# Patient Record
Sex: Male | Born: 1955 | Race: White | Hispanic: No | Marital: Married | Smoking: Never smoker
Health system: Southern US, Community
[De-identification: ages and names within clinical notes are randomized; demographics above are authoritative.]

## PROBLEM LIST (undated history)

## (undated) DIAGNOSIS — N529 Male erectile dysfunction, unspecified: Secondary | ICD-10-CM

## (undated) DIAGNOSIS — R7301 Impaired fasting glucose: Secondary | ICD-10-CM

## (undated) DIAGNOSIS — G603 Idiopathic progressive neuropathy: Secondary | ICD-10-CM

## (undated) DIAGNOSIS — E785 Hyperlipidemia, unspecified: Secondary | ICD-10-CM

## (undated) DIAGNOSIS — M722 Plantar fascial fibromatosis: Secondary | ICD-10-CM

## (undated) DIAGNOSIS — R202 Paresthesia of skin: Secondary | ICD-10-CM

## (undated) DIAGNOSIS — I69898 Other sequelae of other cerebrovascular disease: Secondary | ICD-10-CM

## (undated) DIAGNOSIS — K219 Gastro-esophageal reflux disease without esophagitis: Secondary | ICD-10-CM

## (undated) DIAGNOSIS — I10 Essential (primary) hypertension: Secondary | ICD-10-CM

## (undated) HISTORY — DX: Plantar fascial fibromatosis: M72.2

## (undated) HISTORY — DX: Impaired fasting glucose: R73.01

## (undated) HISTORY — DX: Idiopathic progressive neuropathy: G60.3

## (undated) HISTORY — DX: Other sequelae of other cerebrovascular disease: I69.898

## (undated) HISTORY — DX: Paresthesia of skin: R20.2

## (undated) HISTORY — PX: HERNIA REPAIR: SHX51

## (undated) HISTORY — DX: Gastro-esophageal reflux disease without esophagitis: K21.9

## (undated) HISTORY — DX: Male erectile dysfunction, unspecified: N52.9

## (undated) HISTORY — DX: Essential (primary) hypertension: I10

## (undated) HISTORY — DX: Hyperlipidemia, unspecified: E78.5

---

## 1979-10-23 HISTORY — PX: TESTICLE REMOVAL: SHX68

## 2009-10-22 LAB — HM COLONOSCOPY

## 2011-11-02 DIAGNOSIS — I10 Essential (primary) hypertension: Secondary | ICD-10-CM | POA: Insufficient documentation

## 2017-09-19 ENCOUNTER — Ambulatory Visit: Payer: Self-pay | Admitting: Neurology

## 2017-09-23 ENCOUNTER — Encounter (INDEPENDENT_AMBULATORY_CARE_PROVIDER_SITE_OTHER): Payer: Self-pay

## 2017-09-23 ENCOUNTER — Ambulatory Visit: Payer: BC Managed Care – PPO | Admitting: Neurology

## 2017-09-23 ENCOUNTER — Encounter: Payer: Self-pay | Admitting: Neurology

## 2017-09-23 VITALS — BP 128/80 | HR 62 | Wt 222.8 lb

## 2017-09-23 DIAGNOSIS — G3184 Mild cognitive impairment, so stated: Secondary | ICD-10-CM

## 2017-09-23 DIAGNOSIS — I6990 Unspecified sequelae of unspecified cerebrovascular disease: Secondary | ICD-10-CM | POA: Diagnosis not present

## 2017-09-23 DIAGNOSIS — R413 Other amnesia: Secondary | ICD-10-CM

## 2017-09-23 NOTE — Patient Instructions (Signed)
I had a long discussion with the patient regarding his mild memory loss and cognitive impairment symptoms. Check lab work for reversible causes of memory loss, EEG and MRI scan of the brain. We discussed memory compensation strategies and encouraged him to be active in mentally challenging activities like playing bridge, sudoku and solving crossword puzzles. We also discussed possible interest in participation in the [facility] [place] early dementia trial given his family history of Alzheimer's but he wants time to think about it Check screening follow-up MRA of the brain and neck. Continue aspirin for stroke prevention but reduced the dose to 81 mg daily due to bruising. Maintain strict control of hypertension with blood pressure goal below 130/90 and lipids with LDL cholesterol goal below 70 mg percent. I encouraged him to eat a healthy diet with lots of fruits, vegetables, cereals and to be active and not gain weight. He will return for follow-up in 2 months or call earlier if necessary  Memory Compensation Strategies  1. Use "WARM" strategy.  W= write it down  A= associate it  R= repeat it  M= make a mental note  2.   You can keep a Glass blower/designerMemory Notebook.  Use a 3-ring notebook with sections for the following: calendar, important names and phone numbers,  medications, doctors' names/phone numbers, lists/reminders, and a section to journal what you did  each day.   3.    Use a calendar to write appointments down.  4.    Write yourself a schedule for the day.  This can be placed on the calendar or in a separate section of the Memory Notebook.  Keeping a  regular schedule can help memory.  5.    Use medication organizer with sections for each day or morning/evening pills.  You may need help loading it  6.    Keep a basket, or pegboard by the door.  Place items that you need to take out with you in the basket or on the pegboard.  You may also want to  include a message board for reminders.  7.    Use  sticky notes.  Place sticky notes with reminders in a place where the task is performed.  For example: " turn off the  stove" placed by the stove, "lock the door" placed on the door at eye level, " take your medications" on  the bathroom mirror or by the place where you normally take your medications.  8.    Use alarms/timers.  Use while cooking to remind yourself to check on food or as a reminder to take your medicine, or as a  reminder to make a call, or as a reminder to perform another task, etc.

## 2017-09-23 NOTE — Progress Notes (Signed)
[facility] [address] [place] [address] [phone]       OFFICE CONSULT NOTE  Mr. Jack CoronaBruce W Pierce Date of Birth:  01/04/1956 Medical Record Number:  [id number]   Referring MD: Blane OharaKirsten Cox  Reason for Referral: Memory loss following stroke HPI: Jack Pierce is a 61 year old pleasant male who has noticed memory difficulties and mild cognitive impairment following remote stroke in 2012.  He was admitted to [facility] at that time an MRI had shown a small left thalamic lacunar infarct.  He saw me for consultation and then was lost to follow-up.  He states for the last 3 years he has had short-term memory difficulties which she feels may be slightly progressing.  He gets frustrated easily since he cannot remember recent appointments and particularly people's faces.  Is also noticed that he can tired easily and cannot concentrate and do prolonged activities.  He had times feels emotionally as well as physically drained.  He has had mild residual numbness and balance difficulties following his stroke which have persisted.  He works as a Programmer, multimediapreacher and feels that he is not able to function at his full capacity.  He does have a family history of Alzheimer's in his mother and worries that he may be developing that.  He denies any recent headaches, focal extremity weakness, numbness, diplopia, blurred vision or falls or head injuries with loss of consciousness.  He has had no documented seizures.  He has had no recurrent stroke or TIA symptoms in the last 6 years.  He remains on aspirin which is tolerating well with only minor bruising.  He states his blood pressure is under good control and he remains on pravastatin.  He is tolerating that as well without significant muscle aches and pains.  ROS:   14 system review of systems is positive for fatigue, wheezing, easy bruising, easy bleeding, confusion, numbness, memory loss, decreased energy, importance and all other  systems negative  PMH:  #1 left thalamic infarct 2012.  #2 cancer 1981.  #3 hernia 1961.  #4 hypertension.  Social History:  Social History   Socioeconomic History  . Marital status: Married    Spouse name: Not on file  . Number of children: Not on file  . Years of education: Not on file  . Highest education level: Not on file  Social Needs  . Financial resource strain: Not on file  . Food insecurity - worry: Not on file  . Food insecurity - inability: Not on file  . Transportation needs - medical: Not on file  . Transportation needs - non-medical: Not on file  Occupational History  . Not on file  Tobacco Use  . Smoking status: Not on file  Substance and Sexual Activity  . Alcohol use: No    Frequency: Never  . Drug use: No  . Sexual activity: Not on file  Other Topics Concern  . Not on file  Social History Narrative  . Not on file    Medications:   Current Outpatient Medications on File Prior to Visit  Medication Sig Dispense Refill  . aspirin EC 81 MG tablet Take 81 mg by mouth daily.    . chlorthalidone (HYGROTON) 25 MG tablet Take 12.5 mg by mouth daily.    Marland Kitchen. lisinopril (PRINIVIL,ZESTRIL) 20 MG tablet Take 20 mg by mouth daily.    . pantoprazole (PROTONIX) 40 MG tablet Take 40 mg by mouth 2 (two) times daily.    . pravastatin (PRAVACHOL) 40  MG tablet Take 40 mg by mouth daily.     No current facility-administered medications on file prior to visit.     Allergies:  No Known Allergies  Physical Exam General: well developed, well nourished pleasant 61-year-old male, seated, in no evident distress Head: head normocephalic and atraumatic.   Neck: supple with no carotid or supraclavicular bruits Cardiovascular: regular rate and rhythm, no murmurs Musculoskeletal: no deformity Skin:  no rash/petichiae Vascular:  Normal pulses all extremities  Neurologic Exam Mental Status: Awake and fully alert. Oriented to place and time. Recent and remote memory intact.  Attention span, concentration and fund of knowledge.  Diminished recall 2/3.  Animal naming test could name only 10 animals with 4 legs.  Clock drawing 3/4.  Appropriate. Mood and affect appropriate.  Cranial Nerves: Fundoscopic exam reveals sharp disc margins. Pupils equal, briskly reactive to light. Extraocular movements full without nystagmus. Visual fields full to confrontation. Hearing intact. Facial sensation intact. Face, tongue, palate moves normally and symmetrically.  Motor: Normal bulk and tone. Normal strength in all tested extremity muscles. Sensory.: intact to touch , pinprick , position and vibratory sensation.  Coordination: Rapid alternating movements normal in all extremities. Finger-to-nose and heel-to-shin performed accurately bilaterally. Gait and Station: Arises from chair without difficulty. Stance is normal. Gait demonstrates normal stride length and balance . Able to heel, toe and tandem walk without difficulty.  Reflexes: 1+ and symmetric. Toes downgoing.   NIHSS 0 Modified Rankin  1   ASSESSMENT:  61 year old male with remote history of left thalamic infarct and 2012 due to small vessel disease with a 3-year history of mild memory disturbance and mild cognitive impairment.   PLAN: I had a long discussion with the patient regarding his mild memory loss and cognitive impairment symptoms. Check lab work for reversible causes of memory loss, EEG and MRI scan of the brain. We discussed memory compensation strategies and encouraged him to be active in mentally challenging activities like playing bridge, sudoku and solving crossword puzzles. We also discussed possible interest in participation in the [facility] [place] early dementia trial given his family history of Alzheimer's but he wants time to think about it Check screening follow-up MRA of the brain and neck. Continue aspirin for stroke prevention but reduced the dose to 81 mg daily due to bruising. Maintain strict control of  hypertension with blood pressure goal below 130/90 and lipids with LDL cholesterol goal below 70 mg percent. I encouraged him to eat a healthy diet with lots of fruits, vegetables, cereals and to be active and not gain weight.  Greater than 50% time during this 50-minute consultation visit was spent on counseling and coordination of care about his memory loss and mild cognitive impairment and remote stroke and answering questions.  He will return for follow-up in 2 months or call earlier if necessary Delia HeadyPramod Lesslie Mossa, MD Medical Director [facility] Pager: [phone] 09/23/2017 8:34 PM  Note: This document was prepared with digital dictation and possible smart phrase technology. Any transcriptional errors that result from this process are unintentional.

## 2017-09-25 LAB — DEMENTIA PANEL
Homocysteine: 22.4 umol/L — ABNORMAL HIGH (ref 0.0–15.0)
RPR: NONREACTIVE
TSH: 2.75 u[IU]/mL (ref 0.450–4.500)
Vitamin B-12: 275 pg/mL (ref 232–1245)

## 2017-10-03 ENCOUNTER — Telehealth: Payer: Self-pay

## 2017-10-03 NOTE — Telephone Encounter (Signed)
Rn was on the phone giving lab work for patient. While on the phone pt stated he had to cancel the EEG, MRA neck, MRA head because of the high payments he would have to make on it monthly. He cannot afford it now.Jack Pierce. He wanted DR.Sethi to know about this.

## 2017-10-03 NOTE — Telephone Encounter (Signed)
-----   Message from Micki RileyPramod S Sethi, MD sent at 09/27/2017  3:09 PM EST ----- Kindly call the patient and let him know that lab work was unremarkable except for elevated homocystine. Ask  patient to start taking folic acid over-the-counter 1 mg daily and if patient is smoking to quit smoking

## 2017-10-03 NOTE — Telephone Encounter (Signed)
FYI Dr. Pearlean BrownieSethi thanks.

## 2017-10-03 NOTE — Telephone Encounter (Signed)
Rn confirmed with patient is not a smoker.

## 2017-10-03 NOTE — Telephone Encounter (Signed)
Notes recorded by Hildred AlaminMurrell, Atziri Zubiate Y, RN on 10/03/2017 at 4:28 PM EST Rn call patient about lab work for dementia panel. Rn stated the lab work was unremarkable but had elevated homocystine. He wants pt to take over the counter 1mg  folic acid. Pt wrote down the otc medication he should take. Pt verbalized understanding. ------

## 2017-10-07 ENCOUNTER — Other Ambulatory Visit: Payer: BC Managed Care – PPO

## 2017-11-28 ENCOUNTER — Telehealth: Payer: Self-pay | Admitting: Neurology

## 2017-11-28 NOTE — Telephone Encounter (Signed)
Patient cancelled upcoming apt due to not having testing done (financial reasons). Wants call back if Dr. Pearlean BrownieSethi feels he still needs to come for follow up. Best call back [phone]

## 2017-11-28 NOTE — Telephone Encounter (Signed)
RN call patient back about cancelling appt with Dr .Pearlean BrownieSethi last week. PT stated he was unable to get all 3 scans because of the required payment. He is on a fix income, and does not have the money now to pay for the MRI. Pt stated also its cheaper to him to seek his PCP its 10.00. When he sees a specialist its 30.00 to 40.00 dollars. He wanted to know about the folic acid 1mg  Dr. Pearlean BrownieSethi recommend. He is taking it OTC, but wanted to know how long he should be on in. Rn stated his homocysteine lab was elevated at 22.4. Rn stated the appt he cancel can be r/s.  He can discuss the elevated homocysteine lab if he reschedules.Pt stated he did not want to r/s due to financial issues. Rn stated he can seek his PCP, and discuss it with her. RN stated the labs will be fax to his PCP to evaluate the lab, and he can follow up with her. Rn will fax labs to PCP.

## 2017-12-02 ENCOUNTER — Ambulatory Visit: Payer: BC Managed Care – PPO | Admitting: Neurology

## 2018-01-07 DIAGNOSIS — M722 Plantar fascial fibromatosis: Secondary | ICD-10-CM | POA: Insufficient documentation

## 2018-01-07 DIAGNOSIS — M6240 Contracture of muscle, unspecified site: Secondary | ICD-10-CM | POA: Insufficient documentation

## 2018-10-30 DIAGNOSIS — G5793 Unspecified mononeuropathy of bilateral lower limbs: Secondary | ICD-10-CM | POA: Insufficient documentation

## 2019-02-26 DIAGNOSIS — I6381 Other cerebral infarction due to occlusion or stenosis of small artery: Secondary | ICD-10-CM | POA: Insufficient documentation

## 2019-04-06 DIAGNOSIS — R202 Paresthesia of skin: Secondary | ICD-10-CM | POA: Insufficient documentation

## 2019-12-21 DIAGNOSIS — E782 Mixed hyperlipidemia: Secondary | ICD-10-CM

## 2019-12-21 DIAGNOSIS — R202 Paresthesia of skin: Secondary | ICD-10-CM

## 2019-12-22 ENCOUNTER — Other Ambulatory Visit: Payer: Self-pay

## 2019-12-22 ENCOUNTER — Encounter: Payer: Self-pay | Admitting: Family Medicine

## 2019-12-22 ENCOUNTER — Ambulatory Visit: Payer: Self-pay | Admitting: Family Medicine

## 2019-12-22 ENCOUNTER — Ambulatory Visit: Payer: BC Managed Care – PPO | Admitting: Family Medicine

## 2019-12-22 VITALS — BP 132/82 | HR 60 | Temp 97.0°F | Resp 16 | Ht 72.0 in | Wt 217.0 lb

## 2019-12-22 DIAGNOSIS — I10 Essential (primary) hypertension: Secondary | ICD-10-CM

## 2019-12-22 DIAGNOSIS — E782 Mixed hyperlipidemia: Secondary | ICD-10-CM | POA: Diagnosis not present

## 2019-12-22 DIAGNOSIS — I69898 Other sequelae of other cerebrovascular disease: Secondary | ICD-10-CM

## 2019-12-22 DIAGNOSIS — G603 Idiopathic progressive neuropathy: Secondary | ICD-10-CM | POA: Insufficient documentation

## 2019-12-22 DIAGNOSIS — R7301 Impaired fasting glucose: Secondary | ICD-10-CM

## 2019-12-22 MED ORDER — PANTOPRAZOLE SODIUM 40 MG PO TBEC: 40.0000 mg | DELAYED_RELEASE_TABLET | Freq: Two times a day (BID) | ORAL | 1 refills | Status: DC

## 2019-12-22 MED ORDER — IBUPROFEN 600 MG PO TABS: 600.0000 mg | ORAL_TABLET | Freq: Three times a day (TID) | ORAL | 5 refills | Status: DC

## 2019-12-22 MED ORDER — PREGABALIN 25 MG PO CAPS: 25.0000 mg | ORAL_CAPSULE | Freq: Three times a day (TID) | ORAL | 0 refills | Status: DC

## 2019-12-22 MED ORDER — FLUTICASONE PROPIONATE 50 MCG/ACT NA SUSP: 2.0000 | Freq: Every day | NASAL | 5 refills | Status: DC

## 2019-12-22 MED ORDER — LAMOTRIGINE 100 MG PO TABS: 100.0000 mg | ORAL_TABLET | Freq: Every day | ORAL | 0 refills | Status: DC

## 2019-12-22 MED ORDER — LISINOPRIL 20 MG PO TABS: 20.0000 mg | ORAL_TABLET | Freq: Every day | ORAL | 1 refills | Status: DC

## 2019-12-22 NOTE — Patient Instructions (Signed)
Start on Lyrica 25 mg one three times a day. Call in one month and let us know how legs are doing. Continue lamictal. No changes to other medicines.

## 2019-12-22 NOTE — Progress Notes (Signed)
Subjective:  Patient ID: Jack Pierce, male    DOB: 01-Mar-1956  Age: 64 y.o. MRN: [id number]  Chief Complaint  Patient presents with  . Hyperlipidemia  . Hypertension    HPI Hyperlipidemia was diagnosed in 2013.  Pravastatin 40 mg 1 at night.  Patient is eating healthy.  Patient is exercising.  Hypertension was diagnosed in 2012.  Patient had a stroke and has residual symptoms of memory loss.  Patient is currently taking lisinopril 20 mg once daily and chlorthalidone 25 mg half pill once daily.  Patient sees Dr. Hartford Poli in [facility]  She has idiopathic progressive neuropathy.  Has been prescribed Lamictal 100 mg once daily.  He failed on gabapentin which made him loopy.  Lyrica, and duloxetine. Social History   Socioeconomic History  . Marital status: Married    Spouse name: Not on file  . Number of children: 2  . Years of education: Not on file  . Highest education level: Not on file  Occupational History  . Occupation: Retired  Tobacco Use  . Smoking status: Never Smoker  . Smokeless tobacco: Never Used  Substance and Sexual Activity  . Alcohol use: No  . Drug use: No  . Sexual activity: Not on file  Other Topics Concern  . Not on file  Social History Narrative  . Not on file   Social Determinants of Health   Financial Resource Strain:   . Difficulty of Paying Living Expenses:   Food Insecurity:   . Worried About Charity fundraiser in the Last Year:   . Arboriculturist in the Last Year:   Transportation Needs:   . Film/video editor (Medical):   Marland Kitchen Lack of Transportation (Non-Medical):   Physical Activity:   . Days of Exercise per Week:   . Minutes of Exercise per Session:   Stress:   . Feeling of Stress :   Social Connections:   . Frequency of Communication with Friends and Family:   . Frequency of Social Gatherings with Friends and Family:   . Attends Religious Services:   . Active Member of Clubs or Organizations:   . Attends Theatre manager Meetings:   Marland Kitchen Marital Status:    Past Medical History:  Diagnosis Date  . Essential (primary) hypertension   . Gastro-esophageal reflux disease without esophagitis   . Hyperlipidemia   . Idiopathic progressive neuropathy   . Impaired fasting glucose   . Male erectile disorder   . Other sequelae of other cerebrovascular disease   . Paresthesias   . Plantar fascial fibromatosis    Family History  Problem Relation Age of Onset  . Heart attack Mother   . Coronary artery disease Father   . CAD Brother   . Suicidality Brother   . CAD Sister     Review of Systems  Constitutional: Negative for chills, fatigue and fever.  HENT: Negative for congestion, ear pain and sore throat.   Respiratory: Negative for cough and shortness of breath.   Cardiovascular: Negative for chest pain.  Gastrointestinal: Negative for abdominal pain, constipation, diarrhea, nausea and vomiting.  Endocrine: Negative for polydipsia, polyphagia and polyuria.  Genitourinary: Negative for dysuria and frequency.  Musculoskeletal: Negative for arthralgias and myalgias.  Neurological: Negative for dizziness and headaches.  Psychiatric/Behavioral: Negative for dysphoric mood.       No dysphoria     Objective:  BP 132/82   Pulse 60   Temp (!) 97 F (36.1 C)  Resp 16   Ht 6' (1.829 m)   Wt 217 lb (98.4 kg)   BMI 29.43 kg/m   BP/Weight 12/22/2019 09/23/2017  Systolic BP 132 128  Diastolic BP 82 80  Wt. (Lbs) 217 222.8  BMI 29.43 -    Physical Exam Vitals reviewed.  Constitutional:      Appearance: Normal appearance.  Neck:     Vascular: No carotid bruit.  Cardiovascular:     Rate and Rhythm: Normal rate and regular rhythm.     Pulses: Normal pulses.     Heart sounds: Normal heart sounds.  Pulmonary:     Effort: Pulmonary effort is normal.     Breath sounds: Normal breath sounds. No wheezing, rhonchi or rales.  Abdominal:     General: Bowel sounds are normal.     Palpations:  Abdomen is soft.     Tenderness: There is no abdominal tenderness.  Neurological:     Mental Status: He is alert.  Psychiatric:        Mood and Affect: Mood normal.        Behavior: Behavior normal.     Lab Results  Component Value Date   WBC 5.8 12/22/2019   HGB 12.7 (L) 12/22/2019   HCT 38.3 12/22/2019   PLT 189 12/22/2019   GLUCOSE 105 (H) 12/22/2019   CHOL 142 12/22/2019   TRIG 108 12/22/2019   HDL 53 12/22/2019   LDLCALC 69 12/22/2019   AST 18 12/22/2019   NA 138 12/22/2019   K 4.4 12/22/2019   CL 97 12/22/2019   CREATININE 1.34 (H) 12/22/2019   BUN 11 12/22/2019   TSH 4.040 12/22/2019   HGBA1C 5.9 (H) 12/22/2019      Assessment & Plan:  Impaired fasting glucose Continue low sugar diet.  Recommend exercise.  Labs drawn today.  Essential hypertension, benign Well controlled.  No changes to medicines.  Continue to work on eating a healthy diet and exercise.  Labs drawn today.   Idiopathic progressive neuropathy Fair control, but still bothers him a lot.. Start on Lyrica 25 mg one three times a day. Call in one month and let us know how legs are doing. Continue lamictal. No changes to other medicines.  Other sequelae of other cerebrovascular disease Patient has memory problems related to the stroke he had.  Recommended to continue aspirin 81 mg once daily and pravastatin.  His memory has not worsened and has just not fully returned to normal nor do I expect it to at this point.  Mixed hyperlipidemia Well controlled.  No changes to medicines.  Continue to work on eating a healthy diet and exercise.  Labs drawn today.   Orders Placed This Encounter  Procedures  . CBC with Differential/Platelet  . Lipid panel  . Hemoglobin A1c  . Comp. Metabolic Panel (12)  . TSH  . Cardiovascular Risk Assessment    Meds ordered this encounter  Medications  . pregabalin (LYRICA) 25 MG capsule    Sig: Take 1 capsule (25 mg total) by mouth 3 (three) times daily.     Dispense:  90 capsule    Refill:  0  . lamoTRIgine (LAMICTAL) 100 MG tablet    Sig: Take 1 tablet (100 mg total) by mouth daily.    Dispense:  90 tablet    Refill:  0  . pantoprazole (PROTONIX) 40 MG tablet    Sig: Take 1 tablet (40 mg total) by mouth 2 (two) times daily.    Dispense:  180  tablet    Refill:  1  . fluticasone (FLONASE) 50 MCG/ACT nasal spray    Sig: Place 2 sprays into both nostrils daily.    Dispense:  16 g    Refill:  5  . lisinopril (ZESTRIL) 20 MG tablet    Sig: Take 1 tablet (20 mg total) by mouth daily.    Dispense:  90 tablet    Refill:  1  . ibuprofen (ADVIL) 600 MG tablet    Sig: Take 1 tablet (600 mg total) by mouth 3 (three) times daily. With food    Dispense:  90 tablet    Refill:  5       Follow-up: Return in about 6 months (around 06/23/2020).  AVS was given to patient prior to departure.  Blane Ohara [facility] [phone]

## 2019-12-23 ENCOUNTER — Other Ambulatory Visit: Payer: Self-pay | Admitting: Family Medicine

## 2019-12-23 LAB — HEMOGLOBIN A1C
Est. average glucose Bld gHb Est-mCnc: 123 mg/dL
Hgb A1c MFr Bld: 5.9 % — ABNORMAL HIGH (ref 4.8–5.6)

## 2019-12-23 LAB — COMP. METABOLIC PANEL (12)
AST: 18 IU/L (ref 0–40)
Albumin/Globulin Ratio: 1.5 (ref 1.2–2.2)
Albumin: 4.5 g/dL (ref 3.8–4.8)
Alkaline Phosphatase: 90 IU/L (ref 39–117)
BUN/Creatinine Ratio: 8 — ABNORMAL LOW (ref 10–24)
BUN: 11 mg/dL (ref 8–27)
Bilirubin Total: 0.6 mg/dL (ref 0.0–1.2)
Calcium: 9.8 mg/dL (ref 8.6–10.2)
Chloride: 97 mmol/L (ref 96–106)
Creatinine, Ser: 1.34 mg/dL — ABNORMAL HIGH (ref 0.76–1.27)
GFR calc Af Amer: 64 mL/min/{1.73_m2} (ref >59)
GFR calc non Af Amer: 56 mL/min/{1.73_m2} — ABNORMAL LOW (ref >59)
Globulin, Total: 3 g/dL (ref 1.5–4.5)
Glucose: 105 mg/dL — ABNORMAL HIGH (ref 65–99)
Potassium: 4.4 mmol/L (ref 3.5–5.2)
Sodium: 138 mmol/L (ref 134–144)
Total Protein: 7.5 g/dL (ref 6.0–8.5)

## 2019-12-23 LAB — LIPID PANEL
Chol/HDL Ratio: 2.7 ratio (ref 0.0–5.0)
Cholesterol, Total: 142 mg/dL (ref 100–199)
HDL: 53 mg/dL (ref >39)
LDL Chol Calc (NIH): 69 mg/dL (ref 0–99)
Triglycerides: 108 mg/dL (ref 0–149)
VLDL Cholesterol Cal: 20 mg/dL (ref 5–40)

## 2019-12-23 LAB — CBC WITH DIFFERENTIAL/PLATELET
Basophils Absolute: 0.1 10*3/uL (ref 0.0–0.2)
Basos: 1 %
EOS (ABSOLUTE): 0.2 10*3/uL (ref 0.0–0.4)
Eos: 4 %
Hematocrit: 38.3 % (ref 37.5–51.0)
Hemoglobin: 12.7 g/dL — ABNORMAL LOW (ref 13.0–17.7)
Immature Grans (Abs): 0 10*3/uL (ref 0.0–0.1)
Immature Granulocytes: 0 %
Lymphocytes Absolute: 1.4 10*3/uL (ref 0.7–3.1)
Lymphs: 24 %
MCH: 27.4 pg (ref 26.6–33.0)
MCHC: 33.2 g/dL (ref 31.5–35.7)
MCV: 83 fL (ref 79–97)
Monocytes Absolute: 0.7 10*3/uL (ref 0.1–0.9)
Monocytes: 12 %
Neutrophils Absolute: 3.5 10*3/uL (ref 1.4–7.0)
Neutrophils: 59 %
Platelets: 189 10*3/uL (ref 150–450)
RBC: 4.64 x10E6/uL (ref 4.14–5.80)
RDW: 13.1 % (ref 11.6–15.4)
WBC: 5.8 10*3/uL (ref 3.4–10.8)

## 2019-12-23 LAB — TSH: TSH: 4.04 u[IU]/mL (ref 0.450–4.500)

## 2019-12-23 LAB — CARDIOVASCULAR RISK ASSESSMENT

## 2019-12-24 ENCOUNTER — Other Ambulatory Visit: Payer: Self-pay

## 2019-12-28 ENCOUNTER — Telehealth (INDEPENDENT_AMBULATORY_CARE_PROVIDER_SITE_OTHER): Payer: BC Managed Care – PPO | Admitting: Legal Medicine

## 2019-12-28 ENCOUNTER — Encounter: Payer: Self-pay | Admitting: Legal Medicine

## 2019-12-28 DIAGNOSIS — J0100 Acute maxillary sinusitis, unspecified: Secondary | ICD-10-CM | POA: Diagnosis not present

## 2019-12-28 DIAGNOSIS — J019 Acute sinusitis, unspecified: Secondary | ICD-10-CM | POA: Insufficient documentation

## 2019-12-28 DIAGNOSIS — J0110 Acute frontal sinusitis, unspecified: Secondary | ICD-10-CM | POA: Insufficient documentation

## 2019-12-28 MED ORDER — CHERATUSSIN AC 100-10 MG/5ML PO SOLN: 5.0000 mL | Freq: Three times a day (TID) | ORAL | 0 refills | Status: DC | PRN

## 2019-12-28 MED ORDER — AZITHROMYCIN 250 MG PO TABS: ORAL_TABLET | ORAL | 0 refills | Status: DC

## 2019-12-28 NOTE — Assessment & Plan Note (Signed)
Drink plenty of fluids, take medicines and nasal spray.  Call back if not improving

## 2019-12-28 NOTE — Progress Notes (Signed)
Brent Bulla, MD  12/28/2019 4:01 PM    [facility]     Virtual Visit via Telephone Note   This visit type was conducted due to national recommendations for restrictions regarding the COVID-19 Pandemic (e.g. social distancing) in an effort to limit this patient's exposure and mitigate transmission in our community.  Due to his co-morbid illnesses, this patient is at least at moderate risk for complications without adequate follow up.  This format is felt to be most appropriate for this patient at this time.  The patient did not have access to video technology/had technical difficulties with video requiring transitioning to audio format only (telephone).  All issues noted in this document were discussed and addressed.  No physical exam could be performed with this format.  Patient verbally consented to a telehealth visit.   Date:  12/28/2019   ID:  Bryson Corona, DOB [birth year], MRN [id number]  Patient Location: Home Provider Location: Office  PCP:  Blane Ohara, MD   Evaluation Performed:  New Patient Evaluation  Chief Complaint:  Sinusitis  History of Present Illness:    Jack Pierce is a 64 y.o. male with sinus congestion with headache,  He has allergies for one week.  No spring allergies, low grade fever.  Sinus pain and cough.  Ears hurting.  The patient does not have symptoms concerning for COVID-19 infection (fever, chills, cough, or new shortness of breath).    Past Medical History:  Diagnosis Date  . Essential (primary) hypertension   . Gastro-esophageal reflux disease without esophagitis   . Hyperlipidemia   . Idiopathic progressive neuropathy   . Impaired fasting glucose   . Male erectile disorder   . Other sequelae of other cerebrovascular disease   . Paresthesias   . Plantar fascial fibromatosis    Past Surgical History:  Procedure Laterality Date  . HERNIA REPAIR  1964 & 1967  . TESTICLE REMOVAL  1981   1 removed    Family History    Problem Relation Age of Onset  . Heart attack Mother   . Coronary artery disease Father   . CAD Brother   . Suicidality Brother   . CAD Sister     Current Meds  Medication Sig  . aspirin 81 MG EC tablet Take 81 mg by mouth 2 (two) times daily.  . B Complex-Folic Acid (B COMPLEX-VITAMIN B12 PO) Take 1 tablet by mouth daily.  . chlorthalidone (HYGROTON) 25 MG tablet Take 12.5 mg by mouth daily.  . fluticasone (FLONASE) 50 MCG/ACT nasal spray Place 2 sprays into both nostrils daily.  Marland Kitchen ibuprofen (ADVIL) 600 MG tablet Take 1 tablet (600 mg total) by mouth 3 (three) times daily. With food  . lamoTRIgine (LAMICTAL) 100 MG tablet Take 1 tablet (100 mg total) by mouth daily.  Marland Kitchen lisinopril (ZESTRIL) 20 MG tablet Take 1 tablet (20 mg total) by mouth daily.  . pantoprazole (PROTONIX) 40 MG tablet Take 1 tablet (40 mg total) by mouth 2 (two) times daily.  . pravastatin (PRAVACHOL) 40 MG tablet TAKE 1 TABLET BY MOUTH AT BEDTIME  . pregabalin (LYRICA) 25 MG capsule Take 1 capsule (25 mg total) by mouth 3 (three) times daily.     Allergies:   Amlodipine besylate, Cozaar [losartan potassium], Cefdinir, and Meloxicam   Social History   Tobacco Use  . Smoking status: Never Smoker  . Smokeless tobacco: Never Used  Substance Use Topics  . Alcohol use: No  . Drug use: No  Family Hx: The patient's family history includes CAD in his brother and sister; Coronary artery disease in his father; Heart attack in his mother; Suicidality in his brother.  ROS:   Please see the history of present illness.    Sinus congestion, dry cough, sinus congestion All other systems reviewed and are negative.  Labs/Other Tests and Data Reviewed:    Recent Labs: 12/22/2019: BUN 11; Creatinine, Ser 1.34; Hemoglobin 12.7; Platelets 189; Potassium 4.4; Sodium 138; TSH 4.040   Recent Lipid Panel Lab Results  Component Value Date/Time   CHOL 142 12/22/2019 08:46 AM   TRIG 108 12/22/2019 08:46 AM   HDL 53  12/22/2019 08:46 AM   CHOLHDL 2.7 12/22/2019 08:46 AM   LDLCALC 69 12/22/2019 08:46 AM    Wt Readings from Last 3 Encounters:  12/22/19 217 lb (98.4 kg)  09/23/17 222 lb 12.8 oz (101.1 kg)     Objective:    Vital Signs:  Temp 99.4 F (37.4 C)    VITAL SIGNS:  reviewed  ASSESSMENT & PLAN:    Problem List Items Addressed This Visit      Respiratory   Acute sinusitis    Drink plenty of fluids, take medicines and nasal spray.  Call back if not improving      Relevant Medications   azithromycin (ZITHROMAX) 250 MG tablet   guaiFENesin-codeine (CHERATUSSIN AC) 100-10 MG/5ML syrup      Acute sinusitis Drink plenty of fluids, take medicines and nasal spray.  Call back if not improving   Meds ordered this encounter  Medications  . azithromycin (ZITHROMAX) 250 MG tablet    Sig: 2 tablets on day 1, then 1 tablet daily on days 2-6.2 tablets on day 1, then 1 tablet daily on days 2-6.    Dispense:  6 tablet    Refill:  0  . guaiFENesin-codeine (CHERATUSSIN AC) 100-10 MG/5ML syrup    Sig: Take 5 mLs by mouth 3 (three) times daily as needed for cough.    Dispense:  120 mL    Refill:  0    COVID-19 Education: The signs and symptoms of COVID-19 were discussed with the patient and how to seek care for testing (follow up with PCP or arrange E-visit). The importance of social distancing was discussed today.  Time:   Today, I have spent 20 minutes with the patient with telehealth technology discussing the above problems.     Medication Adjustments/Labs and Tests Ordered: Current medicines are reviewed at length with the patient today.  Concerns regarding medicines are outlined above.   Tests Ordered: No orders of the defined types were placed in this encounter.   Medication Changes: Meds ordered this encounter  Medications  . azithromycin (ZITHROMAX) 250 MG tablet    Sig: 2 tablets on day 1, then 1 tablet daily on days 2-6.2 tablets on day 1, then 1 tablet daily on days  2-6.    Dispense:  6 tablet    Refill:  0  . guaiFENesin-codeine (CHERATUSSIN AC) 100-10 MG/5ML syrup    Sig: Take 5 mLs by mouth 3 (three) times daily as needed for cough.    Dispense:  120 mL    Refill:  0    Follow Up:  Either In Person or Virtual prn  Signed, Reinaldo Meeker, MD  12/28/2019 4:01 PM    [place]

## 2020-01-03 ENCOUNTER — Encounter: Payer: Self-pay | Admitting: Family Medicine

## 2020-01-03 DIAGNOSIS — I69898 Other sequelae of other cerebrovascular disease: Secondary | ICD-10-CM | POA: Insufficient documentation

## 2020-01-03 NOTE — Assessment & Plan Note (Signed)
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  Recommended to continue aspirin 81 mg once daily and pravastatin.  His memory has not worsened and has just not fully returned to normal nor do I expect it to at this point.

## 2020-01-03 NOTE — Assessment & Plan Note (Addendum)
Fair control, but still bothers him a lot.. Start on Lyrica 25 mg one three times a day. Call in one month and let us know how legs are doing. Continue lamictal. No changes to other medicines.

## 2020-01-03 NOTE — Assessment & Plan Note (Signed)
Well controlled.  ?No changes to medicines.  ?Continue to work on eating a healthy diet and exercise.  ?Labs drawn today.  ?

## 2020-01-03 NOTE — Assessment & Plan Note (Signed)
Continue low sugar diet.  Recommend exercise.  Labs drawn today.

## 2020-01-05 ENCOUNTER — Ambulatory Visit: Payer: BC Managed Care – PPO | Admitting: Legal Medicine

## 2020-02-02 ENCOUNTER — Other Ambulatory Visit: Payer: Self-pay | Admitting: Family Medicine

## 2020-02-02 DIAGNOSIS — G603 Idiopathic progressive neuropathy: Secondary | ICD-10-CM

## 2020-03-20 ENCOUNTER — Other Ambulatory Visit: Payer: Self-pay | Admitting: Family Medicine

## 2020-03-29 ENCOUNTER — Other Ambulatory Visit: Payer: Self-pay | Admitting: Family Medicine

## 2020-04-20 ENCOUNTER — Other Ambulatory Visit: Payer: Self-pay | Admitting: Family Medicine

## 2020-04-22 ENCOUNTER — Encounter: Payer: Self-pay | Admitting: Physician Assistant

## 2020-04-22 ENCOUNTER — Telehealth (INDEPENDENT_AMBULATORY_CARE_PROVIDER_SITE_OTHER): Payer: BC Managed Care – PPO | Admitting: Physician Assistant

## 2020-04-22 VITALS — BP 131/80 | HR 68 | Temp 98.3°F | Wt 212.0 lb

## 2020-04-22 DIAGNOSIS — J029 Acute pharyngitis, unspecified: Secondary | ICD-10-CM | POA: Diagnosis not present

## 2020-04-22 MED ORDER — AZITHROMYCIN 250 MG PO TABS: ORAL_TABLET | ORAL | 0 refills | Status: DC

## 2020-04-22 NOTE — Progress Notes (Signed)
Virtual Visit via Telephone Note   This visit type was conducted due to national recommendations for restrictions regarding the COVID-19 Pandemic (e.g. social distancing) in an effort to limit this patient's exposure and mitigate transmission in our community.  Due to his co-morbid illnesses, this patient is at least at moderate risk for complications without adequate follow up.  This format is felt to be most appropriate for this patient at this time.  The patient did not have access to video technology/had technical difficulties with video requiring transitioning to audio format only (telephone).  All issues noted in this document were discussed and addressed.  No physical exam could be performed with this format.  Patient verbally consented to a telehealth visit.   Date:  04/22/2020   ID:  Jack Pierce, DOB 1956-04-07, MRN [id number]  Patient Location: Home Provider Location: Office  PCP:  Blane Ohara, MD     Chief Complaint:  pharyngitis  History of Present Illness:    Jack Pierce is a 64 y.o. male with pharyngitis Pt states for the past few days he has had sore throat and hoarseness - has had mild congestion as well Denies fever or malaise  The patient does not have symptoms concerning for COVID-19 infection (fever, chills, cough, or new shortness of breath).    Past Medical History:  Diagnosis Date   Essential (primary) hypertension    Gastro-esophageal reflux disease without esophagitis    Hyperlipidemia    Idiopathic progressive neuropathy    Impaired fasting glucose    Male erectile disorder    Other sequelae of other cerebrovascular disease    Paresthesias    Plantar fascial fibromatosis    Past Surgical History:  Procedure Laterality Date   HERNIA REPAIR  1964 & 1967   TESTICLE REMOVAL  1981   1 removed     Current Meds  Medication Sig   aspirin 81 MG EC tablet Take 81 mg by mouth 2 (two) times daily.   B Complex-Folic Acid (B  COMPLEX-VITAMIN B12 PO) Take 1 tablet by mouth daily.   chlorthalidone (HYGROTON) 25 MG tablet Take 1/2 (one-half) tablet by mouth once daily   fluticasone (FLONASE) 50 MCG/ACT nasal spray Place 2 sprays into both nostrils daily.   ibuprofen (ADVIL) 600 MG tablet Take 1 tablet (600 mg total) by mouth 3 (three) times daily. With food   lamoTRIgine (LAMICTAL) 100 MG tablet Take 1 tablet by mouth once daily   lisinopril (ZESTRIL) 20 MG tablet Take 1 tablet (20 mg total) by mouth daily.   pantoprazole (PROTONIX) 40 MG tablet Take 1 tablet (40 mg total) by mouth 2 (two) times daily.   pravastatin (PRAVACHOL) 40 MG tablet TAKE 1 TABLET BY MOUTH AT BEDTIME   pregabalin (LYRICA) 25 MG capsule TAKE 1 CAPSULE BY MOUTH THREE TIMES DAILY (Patient taking differently: daily. )     Allergies:   Amlodipine besylate, Cozaar [losartan potassium], Cefdinir, and Meloxicam   Social History   Tobacco Use   Smoking status: Never Smoker   Smokeless tobacco: Never Used  Vaping Use   Vaping Use: Never used  Substance Use Topics   Alcohol use: No   Drug use: No     Family Hx: The patient's family history includes CAD in his brother and sister; Coronary artery disease in his father; Heart attack in his mother; Suicidality in his brother.  ROS:   Please see the history of present illness.    All other systems reviewed and are  negative.  Labs/Other Tests and Data Reviewed:    Recent Labs: 12/22/2019: BUN 11; Creatinine, Ser 1.34; Hemoglobin 12.7; Platelets 189; Potassium 4.4; Sodium 138; TSH 4.040   Recent Lipid Panel Lab Results  Component Value Date/Time   CHOL 142 12/22/2019 08:46 AM   TRIG 108 12/22/2019 08:46 AM   HDL 53 12/22/2019 08:46 AM   CHOLHDL 2.7 12/22/2019 08:46 AM   LDLCALC 69 12/22/2019 08:46 AM    Wt Readings from Last 3 Encounters:  04/22/20 212 lb (96.2 kg)  12/22/19 217 lb (98.4 kg)  09/23/17 222 lb 12.8 oz (101.1 kg)     Objective:    Vital Signs:  BP 131/80     Pulse 68    Temp 98.3 F (36.8 C)    Wt 212 lb (96.2 kg)    BMI 28.75 kg/m    VITAL SIGNS:  reviewed  ASSESSMENT & PLAN:    Pharyngitis- rx for zpack and recommend salt water gargles and continue nasal spray as directed  COVID-19 Education: The signs and symptoms of COVID-19 were discussed with the patient and how to seek care for testing (follow up with PCP or arrange E-visit). The importance of social distancing was discussed today.  Time:   Today, I have spent 10 minutes with the patient with telehealth technology discussing the above problems.     Medication Adjustments/Labs and Tests Ordered: Current medicines are reviewed at length with the patient today.  Concerns regarding medicines are outlined above.   Tests Ordered: No orders of the defined types were placed in this encounter.   Medication Changes: No orders of the defined types were placed in this encounter.   Follow Up:  In Person prn  Signed, Jettie Pagan  04/22/2020 9:43 AM    [facility]

## 2020-04-22 NOTE — Assessment & Plan Note (Signed)
rx for zpack Warm salt water gargles

## 2020-06-10 ENCOUNTER — Other Ambulatory Visit: Payer: Self-pay | Admitting: Family Medicine

## 2020-06-20 ENCOUNTER — Other Ambulatory Visit: Payer: Self-pay | Admitting: Family Medicine

## 2020-06-22 NOTE — Progress Notes (Signed)
Virtual Visit via Telephone Note   This visit type was conducted due to national recommendations for restrictions regarding the COVID-19 Pandemic (e.g. social distancing) in an effort to limit this patient's exposure and mitigate transmission in our community.  Due to his co-morbid illnesses, this patient is at least at moderate risk for complications without adequate follow up.  This format is felt to be most appropriate for this patient at this time.  The patient did not have access to video technology/had technical difficulties with video requiring transitioning to audio format only (telephone).  All issues noted in this document were discussed and addressed.  No physical exam could be performed with this format.  Patient verbally consented to a telehealth visit.   Date:  06/27/2020   ID:  Jack Pierce, DOB 02-20-1956, MRN [id number]  Patient Location: Home Provider Location: Office/Clinic  PCP:  Blane Ohara, MD   Evaluation Performed:  Follow-Up Visit  Chief Complaint:  Follow up of htn.   History of Present Illness:    Jack Pierce is a 64 y.o. male for chronic follow up.    Pt presents for follow up of hypertension.  Date of diagnosis 03/2011.  His current cardiac medication regimen includes an ACE inhibitor ( Lisinopril ) and chlorthalidone 25 mg 1/2 daily.  Compliance with treatment has been good; he takes his medication as directed and follows up as directed fair; he eats a fairly healthy diet and very little exercise due to neuropathy.Marland Kitchen      Elye presents with a diagnosis of impaired fasting glucose. The course has been stable and nonprogressive.  Eating fairly healthy.     Pt presents with hyperlipidemia.  Date of diagnosis 10/2011.  Current treatment includes Pravachol.  Compliance with treatment has been good; he takes his medication as directed and follows up as directed.  He denies experiencing any hypercholesterolemia related symptoms.    Patient has neuropathy  and was prescribed lamotrigine and lyrica both once daily. Lyrica three times a day makes him loopy. Limited walking. Takes ibuprofen 600 mg one twice a day.  He has had an EMG NCV.  He is seeing neurology at [facility].  They are not operating him any other treatments he says.  He is requesting diabetic shoes due to his neuropathy.  Patient is not diabetic but he certainly has neuropathy.  The patient does not have symptoms concerning for COVID-19 infection (fever, chills, cough, or new shortness of breath).    Past Medical History:  Diagnosis Date  . Essential (primary) hypertension   . Gastro-esophageal reflux disease without esophagitis   . Hyperlipidemia   . Idiopathic progressive neuropathy   . Impaired fasting glucose   . Male erectile disorder   . Other sequelae of other cerebrovascular disease   . Paresthesias   . Plantar fascial fibromatosis     Past Surgical History:  Procedure Laterality Date  . HERNIA REPAIR  1964 & 1967  . 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 Heart attack Mother   . Coronary artery disease Father   . CAD Brother   . Suicidality Brother   . CAD Sister     Social History   Socioeconomic History  . Marital status: Married    Spouse name: Not on file  . Number of children: 2  . Years of education: Not on file  . Highest education level: Not on file  Occupational  History  . Occupation: Retired  Tobacco Use  . Smoking status: Never Smoker  . Smokeless tobacco: Never Used  Vaping Use  . Vaping Use: Never used  Substance and Sexual Activity  . Alcohol use: No  . Drug use: No  . Sexual activity: Not on file  Other Topics Concern  . Not on file  Social History Narrative  . Not on file   Social Determinants of Health   Financial Resource Strain:   . Difficulty of Paying Living Expenses: Not on file  Food Insecurity:   . Worried About Programme researcher, broadcasting/film/video in the Last Year: Not on file  .  Ran Out of Food in the Last Year: Not on file  Transportation Needs:   . Lack of Transportation (Medical): Not on file  . Lack of Transportation (Non-Medical): Not on file  Physical Activity:   . Days of Exercise per Week: Not on file  . Minutes of Exercise per Session: Not on file  Stress:   . Feeling of Stress : Not on file  Social Connections:   . Frequency of Communication with Friends and Family: Not on file  . Frequency of Social Gatherings with Friends and Family: Not on file  . Attends Religious Services: Not on file  . Active Member of Clubs or Organizations: Not on file  . Attends Banker Meetings: Not on file  . Marital Status: Not on file  Intimate Partner Violence:   . Fear of Current or Ex-Partner: Not on file  . Emotionally Abused: Not on file  . Physically Abused: Not on file  . Sexually Abused: Not on file    Outpatient Medications Prior to Visit  Medication Sig Dispense Refill  . aspirin 81 MG EC tablet Take 81 mg by mouth 2 (two) times daily.    . B Complex-Folic Acid (B COMPLEX-VITAMIN B12 PO) Take 1 tablet by mouth daily.    . fluticasone (FLONASE) 50 MCG/ACT nasal spray Place 2 sprays into both nostrils daily. 16 g 5  . ibuprofen (ADVIL) 600 MG tablet Take 1 tablet (600 mg total) by mouth 3 (three) times daily. With food 90 tablet 5  . pravastatin (PRAVACHOL) 40 MG tablet TAKE 1 TABLET BY MOUTH AT BEDTIME 90 tablet 1  . pregabalin (LYRICA) 25 MG capsule TAKE 1 CAPSULE BY MOUTH THREE TIMES DAILY (Patient taking differently: daily. ) 90 capsule 3  . azithromycin (ZITHROMAX) 250 MG tablet 2 po day one then 1 po days 2-5 6 each 0  . chlorthalidone (HYGROTON) 25 MG tablet Take 1/2 (one-half) tablet by mouth once daily 45 tablet 0  . lamoTRIgine (LAMICTAL) 100 MG tablet Take 1 tablet by mouth once daily 90 tablet 0  . lisinopril (ZESTRIL) 20 MG tablet Take 1 tablet by mouth once daily 90 tablet 0  . pantoprazole (PROTONIX) 40 MG tablet Take 1 tablet by  mouth twice daily 180 tablet 0   No facility-administered medications prior to visit.   .med Allergies:   Amlodipine besylate, Cozaar [losartan potassium], Cefdinir, and Meloxicam   Social History   Tobacco Use  . Smoking status: Never Smoker  . Smokeless tobacco: Never Used  Vaping Use  . Vaping Use: Never used  Substance Use Topics  . Alcohol use: No  . Drug use: No     Review of Systems  Constitutional: Negative for chills, fever and malaise/fatigue.  HENT: Negative for ear pain, sinus pain and sore throat.   Respiratory: Positive for shortness of  breath. Negative for cough.   Cardiovascular: Negative for chest pain.  Gastrointestinal: Positive for constipation (taking miralax once daily works. ). Negative for diarrhea, nausea and vomiting.  Musculoskeletal: Negative for back pain, falls, joint pain and myalgias.  Neurological: Positive for dizziness and tingling. Negative for headaches.  Psychiatric/Behavioral: Negative for depression. The patient is not nervous/anxious.      Labs/Other Tests and Data Reviewed:    Recent Labs: 12/22/2019: BUN 11; Creatinine, Ser 1.34; Hemoglobin 12.7; Platelets 189; Potassium 4.4; Sodium 138; TSH 4.040   Recent Lipid Panel Lab Results  Component Value Date/Time   CHOL 142 12/22/2019 08:46 AM   TRIG 108 12/22/2019 08:46 AM   HDL 53 12/22/2019 08:46 AM   CHOLHDL 2.7 12/22/2019 08:46 AM   LDLCALC 69 12/22/2019 08:46 AM    Wt Readings from Last 3 Encounters:  06/23/20 210 lb (95.3 kg)  04/22/20 212 lb (96.2 kg)  12/22/19 217 lb (98.4 kg)     Objective:    Vital Signs:  BP 135/81   Pulse (!) 59   Temp 98.2 F (36.8 C)   Ht 6' (1.829 m)   Wt 210 lb (95.3 kg)   BMI 28.48 kg/m    Physical Exam Vitals reviewed.  Pulmonary:     Effort: Pulmonary effort is normal.  Neurological:     Mental Status: He is alert.  Psychiatric:        Mood and Affect: Mood normal.        Behavior: Behavior normal.    .f  ASSESSMENT & PLAN:    1. Shortness of breath Unclear etiology. - DG Chest 2 View; Future  2. Mixed hyperlipidemia Recommend healthy diet and exercise.  Labs drawn today.  - Lipid panel; Future  3. Impaired fasting glucose - Hemoglobin A1c; Future  4. Idiopathic progressive neuropathy Start amitriptyline. May increase lyrica form 25 mg to 2 at night. Sometimes the side effects are less with gradually increasing.  Will perform monofilament exam on patient next week when he comes for labs and for a flu shot.  - amitriptyline (ELAVIL) 25 MG tablet; Take 1 tablet (25 mg total) by mouth at bedtime.  Dispense: 30 tablet; Refill: 2  5. Essential hypertension, benign The current medical regimen is effective;  continue present plan and medications. Recommend continue to work on eating healthy diet and exercise. - CBC with Differential/Platelet; Future - Comprehensive metabolic panel; Future  COVID-19 Education: The signs and symptoms of COVID-19 were discussed with the patient and how to seek care for testing (follow up with PCP or arrange E-visit). The importance of social distancing was discussed today.  Time:   Today, I have spent 30  minutes with the patient with telehealth technology discussing the above problems.    Follow Up:  In Person in a few day(s)   Needs labwork, flucelvex shot, thorough monofilament foot exam, and cxr order.  SignedBlane Ohara, MD  06/27/2020 12:45 PM    [facility] [place]

## 2020-06-23 ENCOUNTER — Telehealth (INDEPENDENT_AMBULATORY_CARE_PROVIDER_SITE_OTHER): Payer: BC Managed Care – PPO | Admitting: Family Medicine

## 2020-06-23 VITALS — BP 135/81 | HR 59 | Temp 98.2°F | Ht 72.0 in | Wt 210.0 lb

## 2020-06-23 DIAGNOSIS — R0602 Shortness of breath: Secondary | ICD-10-CM | POA: Diagnosis not present

## 2020-06-23 DIAGNOSIS — G603 Idiopathic progressive neuropathy: Secondary | ICD-10-CM | POA: Diagnosis not present

## 2020-06-23 DIAGNOSIS — E782 Mixed hyperlipidemia: Secondary | ICD-10-CM

## 2020-06-23 DIAGNOSIS — R7301 Impaired fasting glucose: Secondary | ICD-10-CM

## 2020-06-23 DIAGNOSIS — I10 Essential (primary) hypertension: Secondary | ICD-10-CM

## 2020-06-23 MED ORDER — PANTOPRAZOLE SODIUM 40 MG PO TBEC
40.0000 mg | DELAYED_RELEASE_TABLET | Freq: Two times a day (BID) | ORAL | 3 refills | Status: DC
Start: 2020-06-23 — End: 2021-07-19

## 2020-06-23 MED ORDER — LISINOPRIL 20 MG PO TABS
20.0000 mg | ORAL_TABLET | Freq: Every day | ORAL | 1 refills | Status: DC
Start: 2020-06-23 — End: 2020-06-28

## 2020-06-23 MED ORDER — AMITRIPTYLINE HCL 25 MG PO TABS: 25.0000 mg | ORAL_TABLET | Freq: Every day | ORAL | 2 refills | Status: DC

## 2020-06-23 MED ORDER — LAMOTRIGINE 100 MG PO TABS
100.0000 mg | ORAL_TABLET | Freq: Every day | ORAL | 1 refills | Status: DC
Start: 2020-06-23 — End: 2021-02-08

## 2020-06-23 MED ORDER — CHLORTHALIDONE 25 MG PO TABS
ORAL_TABLET | ORAL | 3 refills | Status: DC
Start: 2020-06-23 — End: 2021-02-06

## 2020-06-27 ENCOUNTER — Encounter: Payer: Self-pay | Admitting: Family Medicine

## 2020-06-27 ENCOUNTER — Other Ambulatory Visit: Payer: Self-pay | Admitting: Family Medicine

## 2020-06-27 NOTE — Patient Instructions (Signed)
May

## 2020-06-29 ENCOUNTER — Other Ambulatory Visit: Payer: Self-pay | Admitting: Family Medicine

## 2020-06-29 ENCOUNTER — Ambulatory Visit (INDEPENDENT_AMBULATORY_CARE_PROVIDER_SITE_OTHER): Payer: BC Managed Care – PPO | Admitting: Family Medicine

## 2020-06-29 ENCOUNTER — Telehealth: Payer: Self-pay

## 2020-06-29 ENCOUNTER — Encounter: Payer: Self-pay | Admitting: Family Medicine

## 2020-06-29 DIAGNOSIS — I10 Essential (primary) hypertension: Secondary | ICD-10-CM

## 2020-06-29 DIAGNOSIS — E782 Mixed hyperlipidemia: Secondary | ICD-10-CM

## 2020-06-29 DIAGNOSIS — G603 Idiopathic progressive neuropathy: Secondary | ICD-10-CM | POA: Diagnosis not present

## 2020-06-29 DIAGNOSIS — Z23 Encounter for immunization: Secondary | ICD-10-CM

## 2020-06-29 DIAGNOSIS — R7301 Impaired fasting glucose: Secondary | ICD-10-CM

## 2020-06-29 NOTE — Telephone Encounter (Signed)
Patient called stating that he was not able to get the chest xray today due to the cost being $669 out of pocket states he has not met his deductible.

## 2020-06-30 LAB — CBC WITH DIFFERENTIAL/PLATELET
Basophils Absolute: 0 10*3/uL (ref 0.0–0.2)
Basos: 1 %
EOS (ABSOLUTE): 0.2 10*3/uL (ref 0.0–0.4)
Eos: 3 %
Hematocrit: 38 % (ref 37.5–51.0)
Hemoglobin: 12.6 g/dL — ABNORMAL LOW (ref 13.0–17.7)
Immature Grans (Abs): 0 10*3/uL (ref 0.0–0.1)
Immature Granulocytes: 1 %
Lymphocytes Absolute: 1.2 10*3/uL (ref 0.7–3.1)
Lymphs: 25 %
MCH: 27.9 pg (ref 26.6–33.0)
MCHC: 33.2 g/dL (ref 31.5–35.7)
MCV: 84 fL (ref 79–97)
Monocytes Absolute: 0.6 10*3/uL (ref 0.1–0.9)
Monocytes: 12 %
Neutrophils Absolute: 2.9 10*3/uL (ref 1.4–7.0)
Neutrophils: 58 %
Platelets: 173 10*3/uL (ref 150–450)
RBC: 4.51 x10E6/uL (ref 4.14–5.80)
RDW: 13.5 % (ref 11.6–15.4)
WBC: 4.9 10*3/uL (ref 3.4–10.8)

## 2020-06-30 LAB — CARDIOVASCULAR RISK ASSESSMENT

## 2020-06-30 LAB — COMPREHENSIVE METABOLIC PANEL
ALT: 13 IU/L (ref 0–44)
AST: 17 IU/L (ref 0–40)
Albumin/Globulin Ratio: 1.7 (ref 1.2–2.2)
Albumin: 4.5 g/dL (ref 3.8–4.8)
Alkaline Phosphatase: 88 IU/L (ref 48–121)
BUN/Creatinine Ratio: 9 — ABNORMAL LOW (ref 10–24)
BUN: 12 mg/dL (ref 8–27)
Bilirubin Total: 0.6 mg/dL (ref 0.0–1.2)
CO2: 28 mmol/L (ref 20–29)
Calcium: 9.5 mg/dL (ref 8.6–10.2)
Chloride: 95 mmol/L — ABNORMAL LOW (ref 96–106)
Creatinine, Ser: 1.32 mg/dL — ABNORMAL HIGH (ref 0.76–1.27)
GFR calc Af Amer: 65 mL/min/{1.73_m2} (ref >59)
GFR calc non Af Amer: 57 mL/min/{1.73_m2} — ABNORMAL LOW (ref >59)
Globulin, Total: 2.6 g/dL (ref 1.5–4.5)
Glucose: 102 mg/dL — ABNORMAL HIGH (ref 65–99)
Potassium: 3.7 mmol/L (ref 3.5–5.2)
Sodium: 136 mmol/L (ref 134–144)
Total Protein: 7.1 g/dL (ref 6.0–8.5)

## 2020-06-30 LAB — LIPID PANEL
Chol/HDL Ratio: 2.7 ratio (ref 0.0–5.0)
Cholesterol, Total: 143 mg/dL (ref 100–199)
HDL: 53 mg/dL (ref >39)
LDL Chol Calc (NIH): 76 mg/dL (ref 0–99)
Triglycerides: 70 mg/dL (ref 0–149)
VLDL Cholesterol Cal: 14 mg/dL (ref 5–40)

## 2020-06-30 LAB — HEMOGLOBIN A1C
Est. average glucose Bld gHb Est-mCnc: 120 mg/dL
Hgb A1c MFr Bld: 5.8 % — ABNORMAL HIGH (ref 4.8–5.6)

## 2020-07-01 ENCOUNTER — Other Ambulatory Visit: Payer: Self-pay

## 2020-07-01 ENCOUNTER — Telehealth: Payer: Self-pay

## 2020-07-01 ENCOUNTER — Encounter: Payer: Self-pay | Admitting: Legal Medicine

## 2020-07-01 ENCOUNTER — Telehealth (INDEPENDENT_AMBULATORY_CARE_PROVIDER_SITE_OTHER): Payer: BC Managed Care – PPO | Admitting: Legal Medicine

## 2020-07-01 VITALS — Ht 72.0 in | Wt 210.0 lb

## 2020-07-01 DIAGNOSIS — J0100 Acute maxillary sinusitis, unspecified: Secondary | ICD-10-CM | POA: Diagnosis not present

## 2020-07-01 MED ORDER — AMOXICILLIN-POT CLAVULANATE 875-125 MG PO TABS: 1.0000 | ORAL_TABLET | Freq: Two times a day (BID) | ORAL | 0 refills | Status: DC

## 2020-07-01 MED ORDER — LISINOPRIL 20 MG PO TABS
20.0000 mg | ORAL_TABLET | Freq: Every day | ORAL | 1 refills | Status: DC
Start: 2020-07-01 — End: 2021-02-06

## 2020-07-01 NOTE — Progress Notes (Signed)
Virtual Visit via Telephone Note   This visit type was conducted due to national recommendations for restrictions regarding the COVID-19 Pandemic (e.g. social distancing) in an effort to limit this patient's exposure and mitigate transmission in our community.  Due to his co-morbid illnesses, this patient is at least at moderate risk for complications without adequate follow up.  This format is felt to be most appropriate for this patient at this time.  The patient did not have access to video technology/had technical difficulties with video requiring transitioning to audio format only (telephone).  All issues noted in this document were discussed and addressed.  No physical exam could be performed with this format.  Patient verbally consented to a telehealth visit.   Date:  07/01/2020   ID:  Jack Pierce, DOB 02/27/1956, MRN [id number]  Patient Location: Home Provider Location: Office/Clinic  PCP:  Blane Ohara, MD   Evaluation Performed:  New Patient Evaluation  Chief Complaint:  Sinus infection  History of Present Illness:    Jack Pierce is a 64 y.o. male with sinus infection with discharge for 3 days, he has recurrently, using nasal spray.  The patient does not have symptoms concerning for COVID-19 infection (fever, chills, cough, or new shortness of breath).    Past Medical History:  Diagnosis Date  . Essential (primary) hypertension   . Gastro-esophageal reflux disease without esophagitis   . Hyperlipidemia   . Idiopathic progressive neuropathy   . Impaired fasting glucose   . Male erectile disorder   . Other sequelae of other cerebrovascular disease   . Paresthesias   . Plantar fascial fibromatosis     Past Surgical History:  Procedure Laterality Date  . HERNIA REPAIR  1964 & 1967  . 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 Heart attack Mother   . Coronary artery disease Father   . CAD Brother   .  Suicidality Brother   . CAD Sister     Social History   Socioeconomic History  . Marital status: Married    Spouse name: Not on file  . Number of children: 2  . Years of education: Not on file  . Highest education level: Not on file  Occupational History  . Occupation: Retired  Tobacco Use  . Smoking status: Never Smoker  . Smokeless tobacco: Never Used  Vaping Use  . Vaping Use: Never used  Substance and Sexual Activity  . Alcohol use: No  . Drug use: No  . Sexual activity: Not on file  Other Topics Concern  . Not on file  Social History Narrative  . Not on file   Social Determinants of Health   Financial Resource Strain:   . Difficulty of Paying Living Expenses: Not on file  Food Insecurity:   . Worried About Programme researcher, broadcasting/film/video in the Last Year: Not on file  . Ran Out of Food in the Last Year: Not on file  Transportation Needs:   . Lack of Transportation (Medical): Not on file  . Lack of Transportation (Non-Medical): Not on file  Physical Activity:   . Days of Exercise per Week: Not on file  . Minutes of Exercise per Session: Not on file  Stress:   . Feeling of Stress : Not on file  Social Connections:   . Frequency of Communication with Friends and Family: Not on file  . Frequency of Social Gatherings with Friends  and Family: Not on file  . Attends Religious Services: Not on file  . Active Member of Clubs or Organizations: Not on file  . Attends Banker Meetings: Not on file  . Marital Status: Not on file  Intimate Partner Violence:   . Fear of Current or Ex-Partner: Not on file  . Emotionally Abused: Not on file  . Physically Abused: Not on file  . Sexually Abused: Not on file    Outpatient Medications Prior to Visit  Medication Sig Dispense Refill  . amitriptyline (ELAVIL) 25 MG tablet Take 1 tablet (25 mg total) by mouth at bedtime. 30 tablet 2  . aspirin 81 MG EC tablet Take 81 mg by mouth 2 (two) times daily.    . B Complex-Folic Acid  (B COMPLEX-VITAMIN B12 PO) Take 1 tablet by mouth daily.    . chlorthalidone (HYGROTON) 25 MG tablet Take 1/2 (one-half) tablet by mouth once daily 45 tablet 3  . fluticasone (FLONASE) 50 MCG/ACT nasal spray Place 2 sprays into both nostrils daily. 16 g 5  . ibuprofen (ADVIL) 600 MG tablet Take 1 tablet (600 mg total) by mouth 3 (three) times daily. With food 90 tablet 5  . lamoTRIgine (LAMICTAL) 100 MG tablet Take 1 tablet (100 mg total) by mouth daily. 90 tablet 1  . lisinopril (ZESTRIL) 20 MG tablet Take 1 tablet by mouth once daily 90 tablet 1  . pantoprazole (PROTONIX) 40 MG tablet Take 1 tablet (40 mg total) by mouth 2 (two) times daily. 180 tablet 3  . pravastatin (PRAVACHOL) 40 MG tablet TAKE 1 TABLET BY MOUTH AT BEDTIME 90 tablet 1  . pregabalin (LYRICA) 25 MG capsule TAKE 1 CAPSULE BY MOUTH THREE TIMES DAILY (Patient taking differently: daily. ) 90 capsule 3   No facility-administered medications prior to visit.   .med Allergies:   Amlodipine besylate, Cozaar [losartan potassium], Cefdinir, and Meloxicam   Social History   Tobacco Use  . Smoking status: Never Smoker  . Smokeless tobacco: Never Used  Vaping Use  . Vaping Use: Never used  Substance Use Topics  . Alcohol use: No  . Drug use: No     Review of Systems  Constitutional: Negative for chills and fever.  HENT: Positive for congestion, sinus pain and sore throat.   Respiratory: Negative.   Cardiovascular: Negative.   Gastrointestinal: Negative.   Genitourinary: Negative.   Musculoskeletal: Negative.   Neurological: Negative.   Psychiatric/Behavioral: Negative.      Labs/Other Tests and Data Reviewed:    Recent Labs: 12/22/2019: TSH 4.040 06/29/2020: ALT 13; BUN 12; Creatinine, Ser 1.32; Hemoglobin 12.6; Platelets 173; Potassium 3.7; Sodium 136   Recent Lipid Panel Lab Results  Component Value Date/Time   CHOL 143 06/29/2020 02:48 PM   TRIG 70 06/29/2020 02:48 PM   HDL 53 06/29/2020 02:48 PM   CHOLHDL 2.7  06/29/2020 02:48 PM   LDLCALC 76 06/29/2020 02:48 PM    Wt Readings from Last 3 Encounters:  07/01/20 210 lb (95.3 kg)  06/23/20 210 lb (95.3 kg)  04/22/20 212 lb (96.2 kg)     Objective:    Vital Signs:  Ht 6' (1.829 m)   Wt 210 lb (95.3 kg)   BMI 28.48 kg/m    Physical Exam VS reviewed  ASSESSMENT & PLAN:   Diagnoses and all orders for this visit: Acute non-recurrent maxillary sinusitis -     amoxicillin-clavulanate (AUGMENTIN) 875-125 MG tablet; Take 1 tablet by mouth 2 (two) times daily. Patient  has sinusitis every fall.  No fever or chills.follow up if not doing well       COVID-19 Education: The signs and symptoms of COVID-19 were discussed with the patient and how to seek care for testing (follow up with PCP or arrange E-visit). The importance of social distancing was discussed today.  Time:   Today, I have spent 20 minutes with the patient with telehealth technology discussing the above problems.    Follow Up:  In Person prn  Signed, Brent Bulla, MD  07/01/2020 11:57 AM    [facility]

## 2020-07-01 NOTE — Telephone Encounter (Signed)
Patient called requested a rx for a sinus infx states he does have some congestion that started yesterday, PND and sinus pain and pressure "behind" his eyes and under his eyes. Dr. Sedalia Muta is aware and will give a verbal order.

## 2020-07-03 ENCOUNTER — Encounter: Payer: Self-pay | Admitting: Family Medicine

## 2020-07-03 NOTE — Progress Notes (Signed)
Acute Office Visit  Subjective:    Patient ID: Jack Pierce, male    DOB: 11-Jun-1956, 64 y.o.   MRN: [id number]  Chief Complaint  Patient presents with  . Peripheral Neuropathy    HPI Patient is in today for labwork, flu shot and needs full foot exam. He has idiopathic neuropathy,but it is causing severe burning pain particularly at night.  Patient is requesting "diabetic" shoes. He has to buy new shoes nearly every 4 months because of the neuropathy and it is very expensive.  Past Medical History:  Diagnosis Date  . Essential (primary) hypertension   . Gastro-esophageal reflux disease without esophagitis   . Hyperlipidemia   . Idiopathic progressive neuropathy   . Impaired fasting glucose   . Male erectile disorder   . Other sequelae of other cerebrovascular disease   . Paresthesias   . Plantar fascial fibromatosis     Past Surgical History:  Procedure Laterality Date  . HERNIA REPAIR  1964 & 1967  . 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 Heart attack Mother   . Coronary artery disease Father   . CAD Brother   . Suicidality Brother   . CAD Sister     Social History   Socioeconomic History  . Marital status: Married    Spouse name: Not on file  . Number of children: 2  . Years of education: Not on file  . Highest education level: Not on file  Occupational History  . Occupation: Retired  Tobacco Use  . Smoking status: Never Smoker  . Smokeless tobacco: Never Used  Vaping Use  . Vaping Use: Never used  Substance and Sexual Activity  . Alcohol use: No  . Drug use: No  . Sexual activity: Not on file  Other Topics Concern  . Not on file  Social History Narrative  . Not on file   Social Determinants of Health   Financial Resource Strain:   . Difficulty of Paying Living Expenses: Not on file  Food Insecurity:   . Worried About Programme researcher, broadcasting/film/video in the Last Year: Not on file  . Ran Out of Food  in the Last Year: Not on file  Transportation Needs:   . Lack of Transportation (Medical): Not on file  . Lack of Transportation (Non-Medical): Not on file  Physical Activity:   . Days of Exercise per Week: Not on file  . Minutes of Exercise per Session: Not on file  Stress:   . Feeling of Stress : Not on file  Social Connections:   . Frequency of Communication with Friends and Family: Not on file  . Frequency of Social Gatherings with Friends and Family: Not on file  . Attends Religious Services: Not on file  . Active Member of Clubs or Organizations: Not on file  . Attends Banker Meetings: Not on file  . Marital Status: Not on file  Intimate Partner Violence:   . Fear of Current or Ex-Partner: Not on file  . Emotionally Abused: Not on file  . Physically Abused: Not on file  . Sexually Abused: Not on file    Outpatient Medications Prior to Visit  Medication Sig Dispense Refill  . amitriptyline (ELAVIL) 25 MG tablet Take 1 tablet (25 mg total) by mouth at bedtime. 30 tablet 2  . aspirin 81 MG EC tablet Take 81 mg by mouth 2 (two) times daily.    Marland Kitchen  B Complex-Folic Acid (B COMPLEX-VITAMIN B12 PO) Take 1 tablet by mouth daily.    . chlorthalidone (HYGROTON) 25 MG tablet Take 1/2 (one-half) tablet by mouth once daily 45 tablet 3  . fluticasone (FLONASE) 50 MCG/ACT nasal spray Place 2 sprays into both nostrils daily. 16 g 5  . ibuprofen (ADVIL) 600 MG tablet Take 1 tablet (600 mg total) by mouth 3 (three) times daily. With food 90 tablet 5  . lamoTRIgine (LAMICTAL) 100 MG tablet Take 1 tablet (100 mg total) by mouth daily. 90 tablet 1  . pantoprazole (PROTONIX) 40 MG tablet Take 1 tablet (40 mg total) by mouth 2 (two) times daily. 180 tablet 3  . pravastatin (PRAVACHOL) 40 MG tablet TAKE 1 TABLET BY MOUTH AT BEDTIME 90 tablet 1  . pregabalin (LYRICA) 25 MG capsule TAKE 1 CAPSULE BY MOUTH THREE TIMES DAILY (Patient taking differently: daily. ) 90 capsule 3  . lisinopril  (ZESTRIL) 20 MG tablet Take 1 tablet by mouth once daily 90 tablet 1   No facility-administered medications prior to visit.    Allergies  Allergen Reactions  . Amlodipine Besylate Swelling  . Cozaar [Losartan Potassium] Other (See Comments)    Dizziness  . Cefdinir   . Meloxicam Other (See Comments)    Dizziness    Review of Systems  Constitutional: Positive for fatigue. Negative for fever.  HENT: Negative for congestion, ear pain, rhinorrhea and sore throat.   Respiratory: Negative for cough and shortness of breath.   Cardiovascular: Negative for chest pain.       Objective:    Physical Exam Constitutional:      Appearance: Normal appearance. He is normal weight.  Neurological:     Mental Status: He is alert.  Psychiatric:        Mood and Affect: Mood normal.    Diabetic Foot Exam - Simple   Simple Foot Form Diabetic Foot exam was performed with the following findings: Yes 06/29/2020  9:30 AM  Visual Inspection Sensation Testing Pulse Check Comments Callus left foot under head of mtp. No ulcers.    There were no vitals taken for this visit. Wt Readings from Last 3 Encounters:  07/01/20 210 lb (95.3 kg)  06/23/20 210 lb (95.3 kg)  04/22/20 212 lb (96.2 kg)    Health Maintenance Due  Topic Date Due  . Hepatitis C Screening  Never done  . COVID-19 Vaccine (1) Never done  . HIV Screening  Never done  . COLONOSCOPY  10/23/2019    There are no preventive care reminders to display for this patient.   Lab Results  Component Value Date   TSH 4.040 12/22/2019   Lab Results  Component Value Date   WBC 4.9 06/29/2020   HGB 12.6 (L) 06/29/2020   HCT 38.0 06/29/2020   MCV 84 06/29/2020   PLT 173 06/29/2020   Lab Results  Component Value Date   NA 136 06/29/2020   K 3.7 06/29/2020   CO2 28 06/29/2020   GLUCOSE 102 (H) 06/29/2020   BUN 12 06/29/2020   CREATININE 1.32 (H) 06/29/2020   BILITOT 0.6 06/29/2020   ALKPHOS 88 06/29/2020   AST 17 06/29/2020     ALT 13 06/29/2020   PROT 7.1 06/29/2020   ALBUMIN 4.5 06/29/2020   CALCIUM 9.5 06/29/2020   Lab Results  Component Value Date   CHOL 143 06/29/2020   Lab Results  Component Value Date   HDL 53 06/29/2020   Lab Results  Component Value Date  LDLCALC 76 06/29/2020   Lab Results  Component Value Date   TRIG 70 06/29/2020   Lab Results  Component Value Date   CHOLHDL 2.7 06/29/2020   Lab Results  Component Value Date   HGBA1C 5.8 (H) 06/29/2020       Assessment & Plan:  1. Idiopathic progressive neuropathy  Started on amitriptyline 25 mg once daily at night.  Foot exam completed. Pt would benefit from "diabetic" shoes, will send to [employer] and hope it will be covered.   2. Encounter for immunization - Flu Vaccine MDCK QUAD PF  3. Mixed hyperlipidemia Labs drawn. 4. Impaired fasting glucose Labs drawn 5. Essential hypertension, benign  labs drawn.  Orders Placed This Encounter  Procedures  . Flu Vaccine MDCK QUAD PF     Follow-up: No follow-ups on file.  An After Visit Summary was printed and given to the patient.  Blane Ohara [facility] [phone]

## 2020-07-21 ENCOUNTER — Encounter: Payer: Self-pay | Admitting: Nurse Practitioner

## 2020-07-21 ENCOUNTER — Other Ambulatory Visit: Payer: Self-pay | Admitting: Nurse Practitioner

## 2020-07-21 ENCOUNTER — Ambulatory Visit (INDEPENDENT_AMBULATORY_CARE_PROVIDER_SITE_OTHER): Payer: BC Managed Care – PPO | Admitting: Nurse Practitioner

## 2020-07-21 VITALS — BP 112/78 | HR 74 | Temp 98.2°F | Ht 72.0 in | Wt 217.0 lb

## 2020-07-21 DIAGNOSIS — J309 Allergic rhinitis, unspecified: Secondary | ICD-10-CM | POA: Diagnosis not present

## 2020-07-21 DIAGNOSIS — U071 COVID-19: Secondary | ICD-10-CM | POA: Diagnosis not present

## 2020-07-21 DIAGNOSIS — J069 Acute upper respiratory infection, unspecified: Secondary | ICD-10-CM | POA: Diagnosis not present

## 2020-07-21 LAB — POC COVID19 BINAXNOW: SARS Coronavirus 2 Ag: POSITIVE — AB

## 2020-07-21 MED ORDER — FLUTICASONE PROPIONATE 50 MCG/ACT NA SUSP: 2.0000 | Freq: Every day | NASAL | 6 refills | Status: DC

## 2020-07-21 MED ORDER — DOXYCYCLINE HYCLATE 100 MG PO TABS: 100.0000 mg | ORAL_TABLET | Freq: Two times a day (BID) | ORAL | 0 refills | Status: AC

## 2020-07-21 NOTE — Patient Instructions (Addendum)
Flonase rx sent.  Doxycycline rx sent.   COVID-19: How to Protect Yourself and Others Know how it spreads  There is currently no vaccine to prevent coronavirus disease 2019 (COVID-19).  The best way to prevent illness is to avoid being exposed to this virus.  The virus is thought to spread mainly from person-to-person. ? Between people who are in close contact with one another (within about 6 feet). ? Through respiratory droplets produced when an infected person coughs, sneezes or talks. ? These droplets can land in the mouths or noses of people who are nearby or possibly be inhaled into the lungs. ? COVID-19 may be spread by people who are not showing symptoms. Everyone should Clean your hands often  Wash your hands often with soap and water for at least 20 seconds especially after you have been in a public place, or after blowing your nose, coughing, or sneezing.  If soap and water are not readily available, use a hand sanitizer that contains at least 60% alcohol. Cover all surfaces of your hands and rub them together until they feel dry.  Avoid touching your eyes, nose, and mouth with unwashed hands. Avoid close contact  Limit contact with others as much as possible.  Avoid close contact with people who are sick.  Put distance between yourself and other people. ? Remember that some people without symptoms may be able to spread virus. ? This is especially important for people who are at higher risk of getting very [employer] Cover your mouth and nose with a mask when around others  You could spread COVID-19 to others even if you do not feel sick.  Everyone should wear a mask in public settings and when around people not living in their household, especially when social distancing is difficult to maintain. ? Masks should not be placed on young children under age 17, anyone who has trouble breathing, or  is unconscious, incapacitated or otherwise unable to remove the mask without assistance.  The mask is meant to protect other people in case you are infected.  Do NOT use a facemask meant for a Research scientist (physical sciences).  Continue to keep about 6 feet between yourself and others. The mask is not a substitute for social distancing. Cover coughs and sneezes  Always cover your mouth and nose with a tissue when you cough or sneeze or use the inside of your elbow.  Throw used tissues in the trash.  Immediately wash your hands with soap and water for at least 20 seconds. If soap and water are not readily available, clean your hands with a hand sanitizer that contains at least 60% alcohol. Clean and disinfect  Clean AND disinfect frequently touched surfaces daily. This includes tables, doorknobs, light switches, countertops, handles, desks, phones, keyboards, toilets, faucets, and sinks. [employer]  If surfaces are dirty, clean them: Use detergent or soap and water prior to disinfection.  Then, use a household disinfectant. You can see a list of EPA-registered household disinfectants here. [email or web address] 06/24/2019 This information is not intended to replace advice given to you by your health care provider. Make sure you discuss any questions you have with your health care provider. Document Revised: 07/02/2019 Document Reviewed: 04/30/2019 Elsevier Patient Education  2020 Elsevier Inc.  COVID-19: Quarantine vs. Isolation QUARANTINE keeps someone who was in close contact with someone who has COVID-19 away from others. If you had close contact with a person who has COVID-19  Stay home until 14 days after your last contact.  Check  your temperature twice a day and watch for symptoms of COVID-19.  If possible, stay away from people who are at higher-risk for getting very sick from COVID-19. ISOLATION keeps someone who is  sick or tested positive for COVID-19 without symptoms away from others, even in their own home. If you are sick and think or know you have COVID-19  Stay home until after ? At least 10 days since symptoms first appeared and ? At least 24 hours with no fever without fever-reducing medication and ? Symptoms have improved If you tested positive for COVID-19 but do not have symptoms  Stay home until after ? 10 days have passed since your positive test and your symptoms have resolve.  If you live with others, stay in a specific "sick room" or area and away from other people or animals, including pets. Use a separate bathroom, if available. [email or web address] 05/11/2019 This information is not intended to replace advice given to you by your health care provider. Make sure you discuss any questions you have with your health care provider. Document Revised: 09/24/2019 Document Reviewed: 09/24/2019 Elsevier Patient Education  2020 Elsevier Inc. Quarantine at home due to positive COVID-19 test.  Doxycycline prescription given for upper respiratory infection Flonase nasal spray daily for allergic sinusitis Continue Ibuprofen over-the-counter for right ear pain and sinus pressure Follow-up with clinic if condition worsens or fail to improve. Seek emergency medical attention as needed.

## 2020-07-21 NOTE — Progress Notes (Signed)
Acute Office Visit  Subjective:    Patient ID: DACOTAH CABELLO, male    DOB: May 25, 1956, 64 y.o.   MRN: [id number]  CC: Right ear pain and sinus congestion and pressure   HPI Jack Pierce is in today for right ear pain and sinus congestion with pressure. He initially began symptoms  He completed a 10-day course of Augmentin on 07/11/20. He states that his symptoms improved "85%" initially. However, after completing antibiotics 10 days ago, sinus pain, congestion, and right ear pain worsened. He is afebrile. He has taken Ibuprofen 400 mg for intermittent ear pain and sinus pressure. He had a positive rapid antigen COVID-19 swab today in the clinic. He was subsequently received PCR COVID-19 testing with results pending.   Past Medical History:  Diagnosis Date  . Essential (primary) hypertension   . Gastro-esophageal reflux disease without esophagitis   . Hyperlipidemia   . Idiopathic progressive neuropathy   . Impaired fasting glucose   . Male erectile disorder   . Other sequelae of other cerebrovascular disease   . Paresthesias   . Plantar fascial fibromatosis     Past Surgical History:  Procedure Laterality Date  . HERNIA REPAIR  1964 & 1967  . 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 Heart attack Mother   . Coronary artery disease Father   . CAD Brother   . Suicidality Brother   . CAD Sister     Social History   Socioeconomic History  . Marital status: Married    Spouse name: Not on file  . Number of children: 2  . Years of education: Not on file  . Highest education level: Not on file  Occupational History  . Occupation: Retired  Tobacco Use  . Smoking status: Never Smoker  . Smokeless tobacco: Never Used  Vaping Use  . Vaping Use: Never used  Substance and Sexual Activity  . Alcohol use: No  . Drug use: No  . Sexual activity: Not on file  Other Topics Concern  . Not on file  Social History Narrative  . Not on  file   Social Determinants of Health   Financial Resource Strain:   . Difficulty of Paying Living Expenses: Not on file  Food Insecurity:   . Worried About Programme researcher, broadcasting/film/video in the Last Year: Not on file  . Ran Out of Food in the Last Year: Not on file  Transportation Needs:   . Lack of Transportation (Medical): Not on file  . Lack of Transportation (Non-Medical): Not on file  Physical Activity:   . Days of Exercise per Week: Not on file  . Minutes of Exercise per Session: Not on file  Stress:   . Feeling of Stress : Not on file  Social Connections:   . Frequency of Communication with Friends and Family: Not on file  . Frequency of Social Gatherings with Friends and Family: Not on file  . Attends Religious Services: Not on file  . Active Member of Clubs or Organizations: Not on file  . Attends Banker Meetings: Not on file  . Marital Status: Not on file  Intimate Partner Violence:   . Fear of Current or Ex-Partner: Not on file  . Emotionally Abused: Not on file  . Physically Abused: Not on file  . Sexually Abused: Not on file    Outpatient Medications Prior to Visit  Medication Sig Dispense Refill  .  amitriptyline (ELAVIL) 25 MG tablet Take 1 tablet (25 mg total) by mouth at bedtime. 30 tablet 2  . amoxicillin-clavulanate (AUGMENTIN) 875-125 MG tablet Take 1 tablet by mouth 2 (two) times daily. 20 tablet 0  . aspirin 81 MG EC tablet Take 81 mg by mouth 2 (two) times daily.    . B Complex-Folic Acid (B COMPLEX-VITAMIN B12 PO) Take 1 tablet by mouth daily.    . chlorthalidone (HYGROTON) 25 MG tablet Take 1/2 (one-half) tablet by mouth once daily 45 tablet 3  . fluticasone (FLONASE) 50 MCG/ACT nasal spray Place 2 sprays into both nostrils daily. 16 g 5  . ibuprofen (ADVIL) 600 MG tablet Take 1 tablet (600 mg total) by mouth 3 (three) times daily. With food 90 tablet 5  . lamoTRIgine (LAMICTAL) 100 MG tablet Take 1 tablet (100 mg total) by mouth daily. 90 tablet 1    . lisinopril (ZESTRIL) 20 MG tablet Take 1 tablet (20 mg total) by mouth daily. 90 tablet 1  . pantoprazole (PROTONIX) 40 MG tablet Take 1 tablet (40 mg total) by mouth 2 (two) times daily. 180 tablet 3  . pravastatin (PRAVACHOL) 40 MG tablet TAKE 1 TABLET BY MOUTH AT BEDTIME 90 tablet 1  . pregabalin (LYRICA) 25 MG capsule TAKE 1 CAPSULE BY MOUTH THREE TIMES DAILY (Patient taking differently: daily. ) 90 capsule 3   No facility-administered medications prior to visit.    Allergies  Allergen Reactions  . Amlodipine Besylate Swelling  . Cozaar [Losartan Potassium] Other (See Comments)    Dizziness  . Cefdinir   . Meloxicam Other (See Comments)    Dizziness    Review of Systems  Constitutional: Negative for activity change, appetite change, chills, fatigue and fever.  HENT: Positive for congestion, ear pain, rhinorrhea, sinus pressure and sinus pain. Negative for facial swelling, sore throat, tinnitus and trouble swallowing.        Negative for loss of taste or smell  Eyes: Positive for itching.       Watery eyes  Respiratory: Negative for cough, chest tightness and shortness of breath.   Cardiovascular: Negative for chest pain and palpitations.  Gastrointestinal: Negative for diarrhea, nausea and vomiting.  Musculoskeletal: Negative for arthralgias and myalgias.  Skin: Negative for rash.  Allergic/Immunologic: Positive for environmental allergies.       Season allergies in Spring and Fall  Neurological: Negative for dizziness, light-headedness and headaches.       Objective:    Physical Exam Constitutional:      Appearance: Normal appearance.  HENT:     Head: Normocephalic.     Right Ear: Tenderness present.     Left Ear: Tympanic membrane normal.     Ears:     Comments: Moderate cerumen noted to right ear canal    Nose:     Right Turbinates: Swollen.     Left Turbinates: Swollen.     Right Sinus: Maxillary sinus tenderness and frontal sinus tenderness present.      Left Sinus: Maxillary sinus tenderness and frontal sinus tenderness present.  Cardiovascular:     Rate and Rhythm: Normal rate and regular rhythm.     Pulses: Normal pulses.     Heart sounds: Normal heart sounds.  Pulmonary:     Effort: Pulmonary effort is normal.     Breath sounds: Normal breath sounds.  Skin:    General: Skin is warm.  Neurological:     Mental Status: He is alert.     BP 112/78 (  BP Location: Left Arm, Patient Position: Sitting)   Pulse 74   Temp 98.2 F (36.8 C) (Temporal)   Ht 6' (1.829 m)   Wt 217 lb (98.4 kg)   SpO2 97%   BMI 29.43 kg/m  Wt Readings from Last 3 Encounters:  07/21/20 217 lb (98.4 kg)  07/01/20 210 lb (95.3 kg)  06/23/20 210 lb (95.3 kg)    Health Maintenance Due  Topic Date Due  . Hepatitis C Screening  Never done  . COVID-19 Vaccine (1) Never done  . HIV Screening  Never done  . COLONOSCOPY  10/23/2019    There are no preventive care reminders to display for this patient.   Lab Results  Component Value Date   TSH 4.040 12/22/2019   Lab Results  Component Value Date   WBC 4.9 06/29/2020   HGB 12.6 (L) 06/29/2020   HCT 38.0 06/29/2020   MCV 84 06/29/2020   PLT 173 06/29/2020   Lab Results  Component Value Date   NA 136 06/29/2020   K 3.7 06/29/2020   CO2 28 06/29/2020   GLUCOSE 102 (H) 06/29/2020   BUN 12 06/29/2020   CREATININE 1.32 (H) 06/29/2020   BILITOT 0.6 06/29/2020   ALKPHOS 88 06/29/2020   AST 17 06/29/2020   ALT 13 06/29/2020   PROT 7.1 06/29/2020   ALBUMIN 4.5 06/29/2020   CALCIUM 9.5 06/29/2020   Lab Results  Component Value Date   CHOL 143 06/29/2020   Lab Results  Component Value Date   HDL 53 06/29/2020   Lab Results  Component Value Date   LDLCALC 76 06/29/2020   Lab Results  Component Value Date   TRIG 70 06/29/2020   Lab Results  Component Value Date   CHOLHDL 2.7 06/29/2020   Lab Results  Component Value Date   HGBA1C 5.8 (H) 06/29/2020       Assessment & Plan:   1.  Upper respiratory tract infection due to COVID-19 virus - doxycycline (VIBRA-TABS) 100 MG tablet; Take 1 tablet (100 mg total) by mouth 2 (two) times daily for 10 days.  Dispense: 20 tablet; Refill: 0  2. Lab test positive for detection of COVID-19 virus - Novel Coronavirus, NAA ([facility])  3. Allergic sinusitis - fluticasone (FLONASE) 50 MCG/ACT nasal spray; Place 2 sprays into both nostrils daily.  Dispense: 16 g; Refill: 6 - Novel Coronavirus, NAA ([facility]) - POC COVID-19    Quarantine at home due to positive COVID-19 test.  Doxycycline prescription given for upper respiratory infection Flonase nasal spray daily for allergic sinusitis Continue Ibuprofen over-the-counter for right ear pain and sinus pressure Follow-up with clinic if condition worsens or fail to improve. Seek emergency medical attention as needed.          Follow-up: As needed  An After Visit Summary was printed and given to the patient.  Janie [facility] [phone]

## 2020-07-22 LAB — SARS-COV-2, NAA 2 DAY TAT

## 2020-07-22 LAB — NOVEL CORONAVIRUS, NAA: SARS-CoV-2, NAA: NOT DETECTED

## 2020-07-28 ENCOUNTER — Ambulatory Visit: Payer: Self-pay | Admitting: Legal Medicine

## 2020-08-01 ENCOUNTER — Ambulatory Visit: Payer: BC Managed Care – PPO | Admitting: Nurse Practitioner

## 2020-08-03 ENCOUNTER — Encounter: Payer: Self-pay | Admitting: Family Medicine

## 2020-08-03 ENCOUNTER — Ambulatory Visit (INDEPENDENT_AMBULATORY_CARE_PROVIDER_SITE_OTHER): Payer: BC Managed Care – PPO | Admitting: Family Medicine

## 2020-08-03 VITALS — BP 134/70 | HR 66 | Temp 95.7°F | Wt 217.0 lb

## 2020-08-03 DIAGNOSIS — M25512 Pain in left shoulder: Secondary | ICD-10-CM | POA: Diagnosis not present
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
## 2020-08-03 NOTE — Patient Instructions (Addendum)
Take ibuprofen three times a day  If no improvement in 3 weeks have shoulder xray done and schedule a follow up to discuss results and possibly do a shoulder injection.   Shoulder Exercises Ask your health care provider which exercises are safe for you. Do exercises exactly as told by your health care provider and adjust them as directed. It is normal to feel mild stretching, pulling, tightness, or discomfort as you do these exercises. Stop right away if you feel sudden pain or your pain gets worse. Do not begin these exercises until told by your health care provider. Stretching exercises External rotation and abduction This exercise is sometimes called corner stretch. This exercise rotates your arm outward (external rotation) and moves your arm out from your body (abduction). 1. Stand in a doorway with one of your feet slightly in front of the other. This is called a staggered stance. If you cannot reach your forearms to the door frame, stand facing a corner of a room. 2. Choose one of the following positions as told by your health care provider: ? Place your hands and forearms on the door frame above your head. ? Place your hands and forearms on the door frame at the height of your head. ? Place your hands on the door frame at the height of your elbows. 3. Slowly move your weight onto your front foot until you feel a stretch across your chest and in the front of your shoulders. Keep your head and chest upright and keep your abdominal muscles tight. 4. Hold for __________ seconds. 5. To release the stretch, shift your weight to your back foot. Repeat __________ times. Complete this exercise __________ times a day. Extension, standing 1. Stand and hold a broomstick, a cane, or a similar object behind your back. ? Your hands should be a little wider than shoulder width apart. ? Your palms should face away from your back. 2. Keeping your elbows straight and your shoulder muscles relaxed, move the  stick away from your body until you feel a stretch in your shoulders (extension). ? Avoid shrugging your shoulders while you move the stick. Keep your shoulder blades tucked down toward the middle of your back. 3. Hold for __________ seconds. 4. Slowly return to the starting position. Repeat __________ times. Complete this exercise __________ times a day. Range-of-motion exercises Pendulum  1. Stand near a wall or a surface that you can hold onto for balance. 2. Bend at the waist and let your left / right arm hang straight down. Use your other arm to support you. Keep your back straight and do not lock your knees. 3. Relax your left / right arm and shoulder muscles, and move your hips and your trunk so your left / right arm swings freely. Your arm should swing because of the motion of your body, not because you are using your arm or shoulder muscles. 4. Keep moving your hips and trunk so your arm swings in the following directions, as told by your health care provider: ? Side to side. ? Forward and backward. ? In clockwise and counterclockwise circles. 5. Continue each motion for __________ seconds, or for as long as told by your health care provider. 6. Slowly return to the starting position. Repeat __________ times. Complete this exercise __________ times a day. Shoulder flexion, standing  1. Stand and hold a broomstick, a cane, or a similar object. Place your hands a little more than shoulder width apart on the object. Your left /  right hand should be palm up, and your other hand should be palm down. 2. Keep your elbow straight and your shoulder muscles relaxed. Push the stick up with your healthy arm to raise your left / right arm in front of your body, and then over your head until you feel a stretch in your shoulder (flexion). ? Avoid shrugging your shoulder while you raise your arm. Keep your shoulder blade tucked down toward the middle of your back. 3. Hold for __________  seconds. 4. Slowly return to the starting position. Repeat __________ times. Complete this exercise __________ times a day. Shoulder abduction, standing 1. Stand and hold a broomstick, a cane, or a similar object. Place your hands a little more than shoulder width apart on the object. Your left / right hand should be palm up, and your other hand should be palm down. 2. Keep your elbow straight and your shoulder muscles relaxed. Push the object across your body toward your left / right side. Raise your left / right arm to the side of your body (abduction) until you feel a stretch in your shoulder. ? Do not raise your arm above shoulder height unless your health care provider tells you to do that. ? If directed, raise your arm over your head. ? Avoid shrugging your shoulder while you raise your arm. Keep your shoulder blade tucked down toward the middle of your back. 3. Hold for __________ seconds. 4. Slowly return to the starting position. Repeat __________ times. Complete this exercise __________ times a day. Internal rotation  1. Place your left / right hand behind your back, palm up. 2. Use your other hand to dangle an exercise band, a towel, or a similar object over your shoulder. Grasp the band with your left / right hand so you are holding on to both ends. 3. Gently pull up on the band until you feel a stretch in the front of your left / right shoulder. The movement of your arm toward the center of your body is called internal rotation. ? Avoid shrugging your shoulder while you raise your arm. Keep your shoulder blade tucked down toward the middle of your back. 4. Hold for __________ seconds. 5. Release the stretch by letting go of the band and lowering your hands. Repeat __________ times. Complete this exercise __________ times a day. Strengthening exercises External rotation  1. Sit in a stable chair without armrests. 2. Secure an exercise band to a stable object at elbow height on  your left / right side. 3. Place a soft object, such as a folded towel or a small pillow, between your left / right upper arm and your body to move your elbow about 4 inches (10 cm) away from your side. 4. Hold the end of the exercise band so it is tight and there is no slack. 5. Keeping your elbow pressed against the soft object, slowly move your forearm out, away from your abdomen (external rotation). Keep your body steady so only your forearm moves. 6. Hold for __________ seconds. 7. Slowly return to the starting position. Repeat __________ times. Complete this exercise __________ times a day. Shoulder abduction  1. Sit in a stable chair without armrests, or stand up. 2. Hold a __________ weight in your left / right hand, or hold an exercise band with both hands. 3. Start with your arms straight down and your left / right palm facing in, toward your body. 4. Slowly lift your left / right hand out to your  side (abduction). Do not lift your hand above shoulder height unless your health care provider tells you that this is safe. ? Keep your arms straight. ? Avoid shrugging your shoulder while you do this movement. Keep your shoulder blade tucked down toward the middle of your back. 5. Hold for __________ seconds. 6. Slowly lower your arm, and return to the starting position. Repeat __________ times. Complete this exercise __________ times a day. Shoulder extension 1. Sit in a stable chair without armrests, or stand up. 2. Secure an exercise band to a stable object in front of you so it is at shoulder height. 3. Hold one end of the exercise band in each hand. Your palms should face each other. 4. Straighten your elbows and lift your hands up to shoulder height. 5. Step back, away from the secured end of the exercise band, until the band is tight and there is no slack. 6. Squeeze your shoulder blades together as you pull your hands down to the sides of your thighs (extension). Stop when your  hands are straight down by your sides. Do not let your hands go behind your body. 7. Hold for __________ seconds. 8. Slowly return to the starting position. Repeat __________ times. Complete this exercise __________ times a day. Shoulder row 1. Sit in a stable chair without armrests, or stand up. 2. Secure an exercise band to a stable object in front of you so it is at waist height. 3. Hold one end of the exercise band in each hand. Position your palms so that your thumbs are facing the ceiling (neutral position). 4. Bend each of your elbows to a 90-degree angle (right angle) and keep your upper arms at your sides. 5. Step back until the band is tight and there is no slack. 6. Slowly pull your elbows back behind you. 7. Hold for __________ seconds. 8. Slowly return to the starting position. Repeat __________ times. Complete this exercise __________ times a day. Shoulder press-ups  1. Sit in a stable chair that has armrests. Sit upright, with your feet flat on the floor. 2. Put your hands on the armrests so your elbows are bent and your fingers are pointing forward. Your hands should be about even with the sides of your body. 3. Push down on the armrests and use your arms to lift yourself off the chair. Straighten your elbows and lift yourself up as much as you comfortably can. ? Move your shoulder blades down, and avoid letting your shoulders move up toward your ears. ? Keep your feet on the ground. As you get stronger, your feet should support less of your body weight as you lift yourself up. 4. Hold for __________ seconds. 5. Slowly lower yourself back into the chair. Repeat __________ times. Complete this exercise __________ times a day. Wall push-ups  1. Stand so you are facing a stable wall. Your feet should be about one arm-length away from the wall. 2. Lean forward and place your palms on the wall at shoulder height. 3. Keep your feet flat on the floor as you bend your elbows and  lean forward toward the wall. 4. Hold for __________ seconds. 5. Straighten your elbows to push yourself back to the starting position. Repeat __________ times. Complete this exercise __________ times a day. This information is not intended to replace advice given to you by your health care provider. Make sure you discuss any questions you have with your health care provider. Document Revised: 01/30/2019 Document Reviewed: 11/07/2018 Elsevier  Patient Education  [employer].

## 2020-09-09 ENCOUNTER — Other Ambulatory Visit: Payer: Self-pay | Admitting: Family Medicine

## 2020-09-09 DIAGNOSIS — G603 Idiopathic progressive neuropathy: Secondary | ICD-10-CM

## 2020-09-12 ENCOUNTER — Other Ambulatory Visit: Payer: Self-pay

## 2020-09-12 DIAGNOSIS — G603 Idiopathic progressive neuropathy: Secondary | ICD-10-CM

## 2020-09-12 MED ORDER — PREGABALIN 25 MG PO CAPS: 25.0000 mg | ORAL_CAPSULE | Freq: Three times a day (TID) | ORAL | 0 refills | Status: DC

## 2020-09-12 MED ORDER — PRAVASTATIN SODIUM 40 MG PO TABS
40.0000 mg | ORAL_TABLET | Freq: Every day | ORAL | 0 refills | Status: DC
Start: 2020-09-12 — End: 2020-12-30

## 2020-12-01 LAB — HM COLONOSCOPY

## 2020-12-02 ENCOUNTER — Encounter: Payer: Self-pay | Admitting: Family Medicine

## 2020-12-30 ENCOUNTER — Other Ambulatory Visit: Payer: Self-pay | Admitting: Family Medicine

## 2021-02-03 ENCOUNTER — Telehealth: Payer: Self-pay

## 2021-02-03 NOTE — Telephone Encounter (Signed)
Pt left VM stating he has been having BP problems.   Called pt back. Pt states he has  Felt off for a little over a week. He has felt flushed, blurry eyes, headache, ringing in ears and people have told him his eyes look red. States symptoms have been off/on. Pt denies L sided weakness, chest pain, or palpitations. 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 He had no change in that side. He is worried due to hx. Pt gave log of BP readings throughout week. They are as follows.   Tuesday: 108/46 lying @ 10 pm Thursday: 112/67  @645  pm       144/71  @ 845 pm       Took nightly lisinopril @ 9 pm w/ pulse at 60      136/69  @10  pm  Friday(today): Daily aspirin and chlorthalidone @ 9 am         146/79 @915  am  Pulse 62        155/85 @10  am  Pulse 58   Spoke with Dr. Dr advises pt be seen in office on Monday.   Advised pt and made appointment for Monday w/ available provider. Pt advised if symptoms worsen to call on call number. Number given to wife.   , 02/03/21 10:52 AM

## 2021-02-03 NOTE — Telephone Encounter (Signed)
Looks like schedule jumped to Dr. Sedalia Muta. Believed I was scheduling with Dr. Marina Goodell. Pt is scheduled w/ Dr. Sedalia Muta. Will route to correct provider.   Lorita Officer, [place] 02/03/21 10:57 AM

## 2021-02-06 ENCOUNTER — Encounter: Payer: Self-pay | Admitting: Family Medicine

## 2021-02-06 ENCOUNTER — Other Ambulatory Visit: Payer: Self-pay

## 2021-02-06 ENCOUNTER — Ambulatory Visit (INDEPENDENT_AMBULATORY_CARE_PROVIDER_SITE_OTHER): Payer: Medicare Other | Admitting: Family Medicine

## 2021-02-06 VITALS — BP 128/76 | HR 60 | Temp 97.4°F | Resp 16 | Ht 72.0 in | Wt 219.6 lb

## 2021-02-06 DIAGNOSIS — R300 Dysuria: Secondary | ICD-10-CM

## 2021-02-06 DIAGNOSIS — R739 Hyperglycemia, unspecified: Secondary | ICD-10-CM

## 2021-02-06 DIAGNOSIS — J0100 Acute maxillary sinusitis, unspecified: Secondary | ICD-10-CM

## 2021-02-06 DIAGNOSIS — I10 Essential (primary) hypertension: Secondary | ICD-10-CM

## 2021-02-06 DIAGNOSIS — E782 Mixed hyperlipidemia: Secondary | ICD-10-CM

## 2021-02-06 LAB — POCT URINALYSIS DIPSTICK
Bilirubin, UA: NEGATIVE
Blood, UA: NEGATIVE
Glucose, UA: NEGATIVE
Ketones, UA: NEGATIVE
Leukocytes, UA: NEGATIVE
Nitrite, UA: NEGATIVE
Protein, UA: NEGATIVE
Spec Grav, UA: 1.015 (ref 1.010–1.025)
Urobilinogen, UA: NEGATIVE E.U./dL — AB
pH, UA: 6 (ref 5.0–8.0)

## 2021-02-06 MED ORDER — AMOXICILLIN 875 MG PO TABS: 875.0000 mg | ORAL_TABLET | Freq: Two times a day (BID) | ORAL | 0 refills | Status: DC

## 2021-02-06 MED ORDER — CHLORTHALIDONE 25 MG PO TABS: 25.0000 mg | ORAL_TABLET | Freq: Every day | ORAL | 0 refills | Status: DC

## 2021-02-06 MED ORDER — LISINOPRIL 20 MG PO TABS: 20.0000 mg | ORAL_TABLET | Freq: Every day | ORAL | 1 refills | Status: DC

## 2021-02-06 NOTE — Patient Instructions (Signed)
Increase chlorthalidone 25 mg daily.  Continue lisinopril 20 mg once daily.  Check bp daily.  Rx for amoxicillin sent for sinusitis.

## 2021-02-06 NOTE — Progress Notes (Signed)
Subjective:  Patient ID: Jack Pierce, male    DOB: 01/15/1956  Age: 65 y.o. MRN: [id number]  Chief Complaint  Patient presents with  . Hypertension    HPI patient presents for follow-up of hypertension.  Last Tuesday he began having some unusual symptoms of flushing, blurry vision, malaise, mild headache. His eyes have been "red" and "weak" looking. Some ringing in his ears.   He had started beet juice to help with his prostate, but has stopped this.  He began taking his bp and it ranged 122/63-155/82.  Pulse 50-60s.    Current Outpatient Medications on File Prior to Visit  Medication Sig Dispense Refill  . Multiple Vitamins-Minerals (ALIVE MULTI-VITAMIN PO) Take by mouth.    Marland Kitchen amitriptyline (ELAVIL) 25 MG tablet Take 1 tablet (25 mg total) by mouth at bedtime. (Patient taking differently: Take 25 mg by mouth at bedtime. ) 30 tablet 2  . aspirin 81 MG EC tablet Take 81 mg by mouth 2 (two) times daily.    . B Complex-Folic Acid (B COMPLEX-VITAMIN B12 PO) Take 1 tablet by mouth daily.    . fluticasone (FLONASE) 50 MCG/ACT nasal spray Place 2 sprays into both nostrils daily. 16 g 6  . ibuprofen (ADVIL) 600 MG tablet Take 1 tablet (600 mg total) by mouth 3 (three) times daily. With food 90 tablet 5  . lamoTRIgine (LAMICTAL) 100 MG tablet Take 1 tablet (100 mg total) by mouth daily. 90 tablet 1  . pantoprazole (PROTONIX) 40 MG tablet Take 1 tablet (40 mg total) by mouth 2 (two) times daily. 180 tablet 3  . pravastatin (PRAVACHOL) 40 MG tablet TAKE 1 TABLET BY MOUTH AT BEDTIME 90 tablet 0  . pregabalin (LYRICA) 25 MG capsule Take 1 capsule (25 mg total) by mouth 3 (three) times daily. (Patient taking differently: Take 25 mg by mouth daily.) 90 capsule 0   No current facility-administered medications on file prior to visit.   Past Medical History:  Diagnosis Date  . Essential (primary) hypertension   . Gastro-esophageal reflux disease without esophagitis   . Hyperlipidemia   .  Idiopathic progressive neuropathy   . Impaired fasting glucose   . Male erectile disorder   . Other sequelae of other cerebrovascular disease   . Paresthesias   . Plantar fascial fibromatosis    Past Surgical History:  Procedure Laterality Date  . HERNIA REPAIR  1964 & 1967  . 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 Heart attack Mother   . Coronary artery disease Father   . CAD Brother   . Suicidality Brother   . CAD Sister    Social History   Socioeconomic History  . Marital status: Married    Spouse name: Not on file  . Number of children: 2  . Years of education: Not on file  . Highest education level: Not on file  Occupational History  . Occupation: Retired  Tobacco Use  . Smoking status: Never Smoker  . Smokeless tobacco: Never Used  Vaping Use  . Vaping Use: Never used  Substance and Sexual Activity  . Alcohol use: No  . Drug use: No  . Sexual activity: Not on file  Other Topics Concern  . Not on file  Social History Narrative  . Not on file   Social Determinants of Health   Financial Resource Strain: Not on file  Food Insecurity: Not on file  Transportation  Needs: Not on file  Physical Activity: Not on file  Stress: Not on file  Social Connections: Not on file    Review of Systems  Constitutional: Negative for chills, diaphoresis and fever.  HENT: Positive for ear pain (rt earache), postnasal drip, rhinorrhea and sneezing. Negative for sore throat.   Eyes: Positive for visual disturbance (blurry vision).  Respiratory: Positive for cough (occasionally. Flonase.). Negative for shortness of breath.   Cardiovascular: Negative for chest pain and leg swelling.  Gastrointestinal: Negative for abdominal pain, constipation, diarrhea, nausea and vomiting.  Genitourinary: Positive for dysuria and frequency (nocturia).  Musculoskeletal: Negative for arthralgias, back pain and myalgias.  Skin:       Flushed on  Tuesday last week.   Neurological: Positive for headaches (mild). Negative for speech difficulty and weakness.     Objective:  BP 128/76   Pulse 60   Temp (!) 97.4 F (36.3 C)   Resp 16   Ht 6' (1.829 m)   Wt 219 lb 9.6 oz (99.6 kg)   BMI 29.78 kg/m   BP/Weight 02/06/2021 08/03/2020 07/21/2020  Systolic BP 128 134 112  Diastolic BP 76 70 78  Wt. (Lbs) 219.6 217 217  BMI 29.78 29.43 29.43    Physical Exam Constitutional:      Appearance: Normal appearance.  HENT:     Right Ear: Tympanic membrane, ear canal and external ear normal.     Left Ear: Tympanic membrane, ear canal and external ear normal.     Nose: Congestion present. No rhinorrhea.     Comments: BL maxillary tenderness    Mouth/Throat:     Mouth: Mucous membranes are moist.     Pharynx: No oropharyngeal exudate or posterior oropharyngeal erythema.  Cardiovascular:     Rate and Rhythm: Normal rate and regular rhythm.     Heart sounds: Normal heart sounds.  Pulmonary:     Effort: Pulmonary effort is normal. No respiratory distress.     Breath sounds: Normal breath sounds. No wheezing, rhonchi or rales.  Abdominal:     General: Bowel sounds are normal.     Palpations: Abdomen is soft.     Tenderness: There is no abdominal tenderness.  Lymphadenopathy:     Cervical: No cervical adenopathy.  Neurological:     Mental Status: He is alert.  Psychiatric:        Mood and Affect: Mood normal.        Behavior: Behavior normal.     Diabetic Foot Exam - Simple   No data filed      Lab Results  Component Value Date   WBC 4.9 06/29/2020   HGB 12.6 (L) 06/29/2020   HCT 38.0 06/29/2020   PLT 173 06/29/2020   GLUCOSE 102 (H) 06/29/2020   CHOL 143 06/29/2020   TRIG 70 06/29/2020   HDL 53 06/29/2020   LDLCALC 76 06/29/2020   ALT 13 06/29/2020   AST 17 06/29/2020   NA 136 06/29/2020   K 3.7 06/29/2020   CL 95 (L) 06/29/2020   CREATININE 1.32 (H) 06/29/2020   BUN 12 06/29/2020   CO2 28 06/29/2020   TSH  4.040 12/22/2019   HGBA1C 5.8 (H) 06/29/2020      Assessment & Plan:   1. Mixed hyperlipidemia At goal.   2. Elevated blood sugar - Hemoglobin A1c Not at goal.   3. Benign hypertension Increase chlorthalidone 25 mg once daily. Continue lisinopril 20 mg once daily.  - TSH - CBC with Differential/Platelet -  Comprehensive metabolic panel  4. Dysuria - POCT urinalysis dipstick normal.  5. Other sinusitis Amoxicillin sent.     Meds ordered this encounter  Medications  . amoxicillin (AMOXIL) 875 MG tablet    Sig: Take 1 tablet (875 mg total) by mouth 2 (two) times daily.    Dispense:  20 tablet    Refill:  0  . chlorthalidone (HYGROTON) 25 MG tablet    Sig: Take 1 tablet (25 mg total) by mouth daily. Take 1/2 (one-half) tablet by mouth once daily    Dispense:  90 tablet    Refill:  0  . lisinopril (ZESTRIL) 20 MG tablet    Sig: Take 1 tablet (20 mg total) by mouth daily.    Dispense:  90 tablet    Refill:  1    Orders Placed This Encounter  Procedures  . TSH  . CBC with Differential/Platelet  . Comprehensive metabolic panel  . Hemoglobin A1c  . POCT urinalysis dipstick     Follow-up: No follow-ups on file.  An After Visit Summary was printed and given to the patient.  Blane Ohara, MD [facility] [phone]

## 2021-02-07 LAB — CBC WITH DIFFERENTIAL/PLATELET
Basophils Absolute: 0.1 10*3/uL (ref 0.0–0.2)
Basos: 1 %
EOS (ABSOLUTE): 0.4 10*3/uL (ref 0.0–0.4)
Eos: 6 %
Hematocrit: 37.2 % — ABNORMAL LOW (ref 37.5–51.0)
Hemoglobin: 12.3 g/dL — ABNORMAL LOW (ref 13.0–17.7)
Immature Grans (Abs): 0 10*3/uL (ref 0.0–0.1)
Immature Granulocytes: 0 %
Lymphocytes Absolute: 1.2 10*3/uL (ref 0.7–3.1)
Lymphs: 21 %
MCH: 27.1 pg (ref 26.6–33.0)
MCHC: 33.1 g/dL (ref 31.5–35.7)
MCV: 82 fL (ref 79–97)
Monocytes Absolute: 0.7 10*3/uL (ref 0.1–0.9)
Monocytes: 12 %
Neutrophils Absolute: 3.4 10*3/uL (ref 1.4–7.0)
Neutrophils: 60 %
Platelets: 196 10*3/uL (ref 150–450)
RBC: 4.54 x10E6/uL (ref 4.14–5.80)
RDW: 13.3 % (ref 11.6–15.4)
WBC: 5.8 10*3/uL (ref 3.4–10.8)

## 2021-02-07 LAB — COMPREHENSIVE METABOLIC PANEL
ALT: 16 IU/L (ref 0–44)
AST: 21 IU/L (ref 0–40)
Albumin/Globulin Ratio: 1.5 (ref 1.2–2.2)
Albumin: 4.6 g/dL (ref 3.8–4.8)
Alkaline Phosphatase: 86 IU/L (ref 44–121)
BUN/Creatinine Ratio: 13 (ref 10–24)
BUN: 16 mg/dL (ref 8–27)
Bilirubin Total: 0.4 mg/dL (ref 0.0–1.2)
CO2: 25 mmol/L (ref 20–29)
Calcium: 8.4 mg/dL — ABNORMAL LOW (ref 8.6–10.2)
Chloride: 96 mmol/L (ref 96–106)
Creatinine, Ser: 1.27 mg/dL (ref 0.76–1.27)
Globulin, Total: 3 g/dL (ref 1.5–4.5)
Glucose: 108 mg/dL — ABNORMAL HIGH (ref 65–99)
Potassium: 4.3 mmol/L (ref 3.5–5.2)
Sodium: 135 mmol/L (ref 134–144)
Total Protein: 7.6 g/dL (ref 6.0–8.5)
eGFR: 63 mL/min/{1.73_m2} (ref >59)

## 2021-02-07 LAB — HEMOGLOBIN A1C
Est. average glucose Bld gHb Est-mCnc: 131 mg/dL
Hgb A1c MFr Bld: 6.2 % — ABNORMAL HIGH (ref 4.8–5.6)

## 2021-02-07 LAB — TSH: TSH: 2.85 u[IU]/mL (ref 0.450–4.500)

## 2021-02-08 ENCOUNTER — Other Ambulatory Visit: Payer: Self-pay | Admitting: Family Medicine

## 2021-03-13 ENCOUNTER — Other Ambulatory Visit: Payer: Self-pay

## 2021-03-13 ENCOUNTER — Encounter: Payer: Self-pay | Admitting: Family Medicine

## 2021-03-13 ENCOUNTER — Ambulatory Visit (INDEPENDENT_AMBULATORY_CARE_PROVIDER_SITE_OTHER): Payer: Medicare Other | Admitting: Family Medicine

## 2021-03-13 VITALS — BP 122/70 | HR 66 | Temp 97.4°F | Ht 72.0 in | Wt 217.0 lb

## 2021-03-13 DIAGNOSIS — M79671 Pain in right foot: Secondary | ICD-10-CM

## 2021-03-13 DIAGNOSIS — R413 Other amnesia: Secondary | ICD-10-CM

## 2021-03-13 DIAGNOSIS — I10 Essential (primary) hypertension: Secondary | ICD-10-CM

## 2021-03-13 DIAGNOSIS — I69398 Other sequelae of cerebral infarction: Secondary | ICD-10-CM

## 2021-03-13 DIAGNOSIS — R2689 Other abnormalities of gait and mobility: Secondary | ICD-10-CM

## 2021-03-13 DIAGNOSIS — G603 Idiopathic progressive neuropathy: Secondary | ICD-10-CM

## 2021-03-13 DIAGNOSIS — R5383 Other fatigue: Secondary | ICD-10-CM | POA: Diagnosis not present

## 2021-03-13 DIAGNOSIS — I6381 Other cerebral infarction due to occlusion or stenosis of small artery: Secondary | ICD-10-CM

## 2021-03-13 DIAGNOSIS — M79672 Pain in left foot: Secondary | ICD-10-CM

## 2021-03-13 DIAGNOSIS — D508 Other iron deficiency anemias: Secondary | ICD-10-CM

## 2021-03-13 DIAGNOSIS — I639 Cerebral infarction, unspecified: Secondary | ICD-10-CM

## 2021-03-13 MED ORDER — PREGABALIN 25 MG PO CAPS: 25.0000 mg | ORAL_CAPSULE | Freq: Every day | ORAL | 0 refills | Status: DC

## 2021-03-13 NOTE — Progress Notes (Signed)
Subjective:  Patient ID: Jack Pierce, male    DOB: May 24, 1956  Age: 65 y.o. MRN: [id number]  Chief Complaint  Patient presents with  . Hypertension    HPI  Hypertension: sbp goes as high as 157. dbp highest at 83.  Patient states that he still has some dizziness periodically. I increase chlorthalidone to 25 mg once daily. No chest pain. No sob. Some lightheadedness.  History of stroke several years ago. He has continued to have memory issues and imbalance issues. He followed up with Dr. Pearlean Brownie in 2018. Dr. Pearlean Brownie recommended at that time to have an MRI of Brain, MRA of brain/neck. Also EEG was recommended.   Foot pain: Pt has had BL foot pain. It burns at times. Hurts all the way from the heel to the toes. Has had a NCV previously.   Fatigue: ongoing. Hb dropped a little at last visit.   Current Outpatient Medications on File Prior to Visit  Medication Sig Dispense Refill  . amitriptyline (ELAVIL) 25 MG tablet Take 1 tablet (25 mg total) by mouth at bedtime. (Patient taking differently: Take 25 mg by mouth at bedtime. ) 30 tablet 2  . aspirin 81 MG EC tablet Take 81 mg by mouth 2 (two) times daily.    . B Complex-Folic Acid (B COMPLEX-VITAMIN B12 PO) Take 1 tablet by mouth daily.    . chlorthalidone (HYGROTON) 25 MG tablet Take 1 tablet (25 mg total) by mouth daily. Take 1/2 (one-half) tablet by mouth once daily (Patient taking differently: Take 25 mg by mouth daily.) 90 tablet 0  . fluticasone (FLONASE) 50 MCG/ACT nasal spray Place 2 sprays into both nostrils daily. 16 g 6  . ibuprofen (ADVIL) 600 MG tablet Take 1 tablet (600 mg total) by mouth 3 (three) times daily. With food 90 tablet 5  . lamoTRIgine (LAMICTAL) 100 MG tablet Take 1 tablet by mouth once daily 90 tablet 0  . lisinopril (ZESTRIL) 20 MG tablet Take 1 tablet (20 mg total) by mouth daily. 90 tablet 1  . Multiple Vitamins-Minerals (ALIVE MULTI-VITAMIN PO) Take by mouth.    . pantoprazole (PROTONIX) 40 MG tablet Take 1  tablet (40 mg total) by mouth 2 (two) times daily. 180 tablet 3  . pravastatin (PRAVACHOL) 40 MG tablet TAKE 1 TABLET BY MOUTH AT BEDTIME 90 tablet 0   No current facility-administered medications on file prior to visit.   Past Medical History:  Diagnosis Date  . Essential (primary) hypertension   . Gastro-esophageal reflux disease without esophagitis   . Hyperlipidemia   . Idiopathic progressive neuropathy   . Impaired fasting glucose   . Male erectile disorder   . Other sequelae of other cerebrovascular disease   . Paresthesias   . Plantar fascial fibromatosis    Past Surgical History:  Procedure Laterality Date  . HERNIA REPAIR  1964 & 1967  . 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 Heart attack Mother   . Coronary artery disease Father   . CAD Brother   . Suicidality Brother   . CAD Sister    Social History   Socioeconomic History  . Marital status: Married    Spouse name: Not on file  . Number of children: 2  . Years of education: Not on file  . Highest education level: Not on file  Occupational History  . Occupation: Retired  Tobacco Use  . Smoking status: Never  Smoker  . Smokeless tobacco: Never Used  Vaping Use  . Vaping Use: Never used  Substance and Sexual Activity  . Alcohol use: No  . Drug use: No  . Sexual activity: Not on file  Other Topics Concern  . Not on file  Social History Narrative  . Not on file   Social Determinants of Health   Financial Resource Strain: Not on file  Food Insecurity: Not on file  Transportation Needs: Not on file  Physical Activity: Not on file  Stress: Not on file  Social Connections: Not on file    Review of Systems  Constitutional: Positive for fatigue. Negative for chills, diaphoresis and fever.  HENT: Positive for congestion and nosebleeds (last week. Took few minutes to stop. Not more than 15 minutes. ). Negative for ear pain and sore throat.   Respiratory:  Negative for shortness of breath.   Cardiovascular: Negative for chest pain.  Gastrointestinal: Negative for abdominal pain, constipation, diarrhea, nausea and vomiting.  Neurological: Positive for dizziness. Negative for headaches.  Psychiatric/Behavioral: The patient is nervous/anxious (last Thursday, not associated chest pain. ).      Objective:  BP 122/70   Pulse 66   Temp (!) 97.4 F (36.3 C)   Ht 6' (1.829 m)   Wt 217 lb (98.4 kg)   SpO2 97%   BMI 29.43 kg/m   BP/Weight 03/13/2021 02/06/2021 08/03/2020  Systolic BP 122 128 134  Diastolic BP 70 76 70  Wt. (Lbs) 217 219.6 217  BMI 29.43 29.78 29.43    Physical Exam Vitals reviewed.  Constitutional:      Appearance: Normal appearance. He is normal weight.  HENT:     Nose:     Comments: Scabbed lesion in rt nare. Lt nare normal. Cardiovascular:     Rate and Rhythm: Normal rate and regular rhythm.     Heart sounds: Normal heart sounds.  Pulmonary:     Effort: Pulmonary effort is normal.     Breath sounds: Normal breath sounds. No wheezing, rhonchi or rales.  Abdominal:     General: Abdomen is flat.     Palpations: Abdomen is soft.     Tenderness: There is no abdominal tenderness.  Neurological:     Mental Status: He is alert.     Coordination: Coordination abnormal (unable to walk heel to toe. mild rhomberg. negative dysmetria. negative pronator drift. ).  Psychiatric:        Mood and Affect: Mood normal.        Behavior: Behavior normal.    Diabetic Foot Exam - Simple   No data filed       Lab Results  Component Value Date   WBC 5.8 02/06/2021   HGB 12.3 (L) 02/06/2021   HCT 37.2 (L) 02/06/2021   PLT 196 02/06/2021   GLUCOSE 108 (H) 02/06/2021   CHOL 143 06/29/2020   TRIG 70 06/29/2020   HDL 53 06/29/2020   LDLCALC 76 06/29/2020   ALT 16 02/06/2021   AST 21 02/06/2021   NA 135 02/06/2021   K 4.3 02/06/2021   CL 96 02/06/2021   CREATININE 1.27 02/06/2021   BUN 16 02/06/2021   CO2 25 02/06/2021    TSH 2.850 02/06/2021   HGBA1C 6.2 (H) 02/06/2021      Assessment & Plan:   1. Essential hypertension, benign Improved. No changes to medicines.  Continue to work on eating a healthy diet and exercise.  Labs drawn today.  - CBC with Differential/Platelet -  Comprehensive metabolic panel  2. Memory loss - MR Brain Wo Contrast - MR Angiogram Neck W Wo Contrast - MR Angiogram Head Wo Contrast  3. Other fatigue Persistent. Recheck cbc, iron studies.   4. Imbalance due to old stroke - MR Brain Wo Contrast - MR Angiogram Neck W Wo Contrast - MR Angiogram Head Wo Contrast  5. Other iron deficiency anemia - CBC with Differential/Platelet - Iron, TIBC and Ferritin Panel  6. Foot pain, bilateral Requested podiatry referral. Per pt exam has not changed.  - Ambulatory referral to Podiatry  7. Idiopathic progressive neuropathy - pregabalin (LYRICA) 25 MG capsule; Take 1 capsule (25 mg total) by mouth daily.  Dispense: 1 capsule; Refill: 0  8. Thalamic stroke (HCC) - MR Brain Wo Contrast - MR Angiogram Neck W Wo Contrast - MR Angiogram Head Wo Contrast    Meds ordered this encounter  Medications  . pregabalin (LYRICA) 25 MG capsule    Sig: Take 1 capsule (25 mg total) by mouth daily.    Dispense:  1 capsule    Refill:  0    Orders Placed This Encounter  Procedures  . MR Brain Wo Contrast  . MR Angiogram Neck W Wo Contrast  . MR Angiogram Head Wo Contrast  . CBC with Differential/Platelet  . Iron, TIBC and Ferritin Panel  . Comprehensive metabolic panel  . Ambulatory referral to Podiatry     I spent 40 minutes face to face and with time reviewing chart.  Follow-up: Return in about 3 months (around 06/13/2021) for fasting.  An After Visit Summary was printed and given to the patient.  Blane Ohara, MD [facility] [phone]

## 2021-03-14 LAB — CBC WITH DIFFERENTIAL/PLATELET
Basophils Absolute: 0.1 10*3/uL (ref 0.0–0.2)
Basos: 1 %
EOS (ABSOLUTE): 0.2 10*3/uL (ref 0.0–0.4)
Eos: 4 %
Hematocrit: 35.5 % — ABNORMAL LOW (ref 37.5–51.0)
Hemoglobin: 12 g/dL — ABNORMAL LOW (ref 13.0–17.7)
Immature Grans (Abs): 0 10*3/uL (ref 0.0–0.1)
Immature Granulocytes: 0 %
Lymphocytes Absolute: 1.5 10*3/uL (ref 0.7–3.1)
Lymphs: 27 %
MCH: 27.2 pg (ref 26.6–33.0)
MCHC: 33.8 g/dL (ref 31.5–35.7)
MCV: 81 fL (ref 79–97)
Monocytes Absolute: 0.7 10*3/uL (ref 0.1–0.9)
Monocytes: 13 %
Neutrophils Absolute: 3 10*3/uL (ref 1.4–7.0)
Neutrophils: 55 %
Platelets: 199 10*3/uL (ref 150–450)
RBC: 4.41 x10E6/uL (ref 4.14–5.80)
RDW: 13.2 % (ref 11.6–15.4)
WBC: 5.5 10*3/uL (ref 3.4–10.8)

## 2021-03-14 LAB — COMPREHENSIVE METABOLIC PANEL
ALT: 15 IU/L (ref 0–44)
AST: 22 IU/L (ref 0–40)
Albumin/Globulin Ratio: 1.6 (ref 1.2–2.2)
Albumin: 4.7 g/dL (ref 3.8–4.8)
Alkaline Phosphatase: 84 IU/L (ref 44–121)
BUN/Creatinine Ratio: 11 (ref 10–24)
BUN: 15 mg/dL (ref 8–27)
Bilirubin Total: 0.5 mg/dL (ref 0.0–1.2)
CO2: 26 mmol/L (ref 20–29)
Calcium: 9.6 mg/dL (ref 8.6–10.2)
Chloride: 95 mmol/L — ABNORMAL LOW (ref 96–106)
Creatinine, Ser: 1.39 mg/dL — ABNORMAL HIGH (ref 0.76–1.27)
Globulin, Total: 2.9 g/dL (ref 1.5–4.5)
Glucose: 110 mg/dL — ABNORMAL HIGH (ref 65–99)
Potassium: 4.1 mmol/L (ref 3.5–5.2)
Sodium: 136 mmol/L (ref 134–144)
Total Protein: 7.6 g/dL (ref 6.0–8.5)
eGFR: 56 mL/min/{1.73_m2} — ABNORMAL LOW (ref >59)

## 2021-03-14 LAB — IRON,TIBC AND FERRITIN PANEL
Ferritin: 26 ng/mL — ABNORMAL LOW (ref 30–400)
Iron Saturation: 17 % (ref 15–55)
Iron: 60 ug/dL (ref 38–169)
Total Iron Binding Capacity: 350 ug/dL (ref 250–450)
UIBC: 290 ug/dL (ref 111–343)

## 2021-03-31 ENCOUNTER — Ambulatory Visit: Payer: Medicare Other | Admitting: Nurse Practitioner

## 2021-03-31 ENCOUNTER — Other Ambulatory Visit: Payer: Self-pay | Admitting: Family Medicine

## 2021-03-31 ENCOUNTER — Other Ambulatory Visit: Payer: Self-pay

## 2021-03-31 DIAGNOSIS — G603 Idiopathic progressive neuropathy: Secondary | ICD-10-CM

## 2021-04-01 MED ORDER — PREGABALIN 25 MG PO CAPS: 25.0000 mg | ORAL_CAPSULE | Freq: Every day | ORAL | 0 refills | Status: DC

## 2021-04-01 MED ORDER — PRAVASTATIN SODIUM 40 MG PO TABS: 40.0000 mg | ORAL_TABLET | Freq: Every day | ORAL | 0 refills | Status: DC

## 2021-04-04 ENCOUNTER — Other Ambulatory Visit: Payer: Self-pay

## 2021-04-04 ENCOUNTER — Ambulatory Visit (INDEPENDENT_AMBULATORY_CARE_PROVIDER_SITE_OTHER): Payer: Medicare Other

## 2021-04-04 ENCOUNTER — Ambulatory Visit (INDEPENDENT_AMBULATORY_CARE_PROVIDER_SITE_OTHER): Payer: Medicare Other | Admitting: Sports Medicine

## 2021-04-04 ENCOUNTER — Encounter: Payer: Self-pay | Admitting: Sports Medicine

## 2021-04-04 DIAGNOSIS — L603 Nail dystrophy: Secondary | ICD-10-CM

## 2021-04-04 DIAGNOSIS — M722 Plantar fascial fibromatosis: Secondary | ICD-10-CM

## 2021-04-04 DIAGNOSIS — G603 Idiopathic progressive neuropathy: Secondary | ICD-10-CM

## 2021-04-04 DIAGNOSIS — M79671 Pain in right foot: Secondary | ICD-10-CM

## 2021-04-04 DIAGNOSIS — M79672 Pain in left foot: Secondary | ICD-10-CM

## 2021-04-04 NOTE — Progress Notes (Signed)
Subjective: Jack Pierce is a 65 y.o. male patient who presents to office for evaluation of right and left foot pain. Patient complains of progressive pain especially over the last 10 years after having a stroke reports that pain is in his heels arches and the balls of both feet burning sensation for the last 10 years with some numbness and worse when he is barefooted she states that he has tried padding and also was treated at [facility] health where they did some studies to check his circulation which was good and also did some studies to check his nerves and states that he was told he needed to have a nerve biopsy but never went back. Patient denies any other pedal complaints.  Reports that he does struggle with memory issues due to stroke.  Denies recent injury/trip/fall/sprain/any other causative factors  Review of Systems  Neurological:  Positive for tingling.  Psychiatric/Behavioral:  Positive for memory loss.   All other systems reviewed and are negative.   Patient Active Problem List   Diagnosis Date Noted   Lab test positive for detection of COVID-19 virus 07/21/2020   Pharyngitis 04/22/2020   Other sequelae of other cerebrovascular disease    Allergic sinusitis 12/28/2019   Essential hypertension, benign 12/22/2019   Idiopathic progressive neuropathy 12/22/2019   Impaired fasting glucose 12/22/2019   Paresthesias 04/06/2019   Thalamic stroke (HCC) 02/26/2019   Neuropathy involving both lower extremities 10/30/2018   Contracture of tendon sheath 01/07/2018   Plantar fasciitis 01/07/2018   Mixed hyperlipidemia 11/11/2012    Current Outpatient Medications on File Prior to Visit  Medication Sig Dispense Refill   amitriptyline (ELAVIL) 25 MG tablet Take 1 tablet (25 mg total) by mouth at bedtime. (Patient taking differently: Take 25 mg by mouth at bedtime. ) 30 tablet 2   aspirin 81 MG EC tablet Take 81 mg by mouth 2 (two) times daily.     B Complex-Folic Acid (B  COMPLEX-VITAMIN B12 PO) Take 1 tablet by mouth daily.     chlorthalidone (HYGROTON) 25 MG tablet Take 1 tablet (25 mg total) by mouth daily. Take 1/2 (one-half) tablet by mouth once daily (Patient taking differently: Take 25 mg by mouth daily.) 90 tablet 0   fluticasone (FLONASE) 50 MCG/ACT nasal spray Place 2 sprays into both nostrils daily. 16 g 6   ibuprofen (ADVIL) 600 MG tablet Take 1 tablet (600 mg total) by mouth 3 (three) times daily. With food 90 tablet 5   lamoTRIgine (LAMICTAL) 100 MG tablet Take 1 tablet by mouth once daily 90 tablet 0   lisinopril (ZESTRIL) 20 MG tablet Take 1 tablet (20 mg total) by mouth daily. 90 tablet 1   Multiple Vitamins-Minerals (ALIVE MULTI-VITAMIN PO) Take by mouth.     pantoprazole (PROTONIX) 40 MG tablet Take 1 tablet (40 mg total) by mouth 2 (two) times daily. 180 tablet 3   pravastatin (PRAVACHOL) 40 MG tablet TAKE 1 TABLET BY MOUTH AT BEDTIME 90 tablet 0   pregabalin (LYRICA) 25 MG capsule TAKE 1 CAPSULE BY MOUTH THREE TIMES DAILY 90 capsule 0   No current facility-administered medications on file prior to visit.    Allergies  Allergen Reactions   Amlodipine Besylate Swelling   Cozaar [Losartan Potassium] Other (See Comments)    Dizziness   Cefdinir    Meloxicam Other (See Comments)    Dizziness    Objective:  General: Alert and oriented x3 in no acute distress  Dermatology: No open lesions bilateral  lower extremities, no webspace macerations, no ecchymosis bilateral, all nails x 10 are well manicured with mild discoloration at the left great toenail and dried blood noted to the right second toenail.  Vascular: Dorsalis Pedis and Posterior Tibial pedal pulses palpable, faintly 1 out of 4 bilateral, capillary Fill Time 5 seconds,(-) pedal hair growth bilateral, trace edema bilateral lower extremities, varicosities bilateral, temperature gradient within normal limits.  Neurology: Gross sensation intact via light touch bilateral, Protective  sensation with Phoebe Perch Monofilament to all pedal sites, vibratory intact bilateral, subjective numbness and tingling to all toes.  Musculoskeletal: Mild tenderness with palpation at plantar fascial insertion at the heel arch bilateral.  There is limited ankle joint range of motion bilateral.  There is cavus deformity with bunion noted at right greater than left.  There is decrease in strength bilateral 4 out of 5.  Gait: Antalgic gait  Xrays  Right and left foot   Impression: Joint space narrowing at the first MPJ with bone spurs consistent with arthritis on the right, there is deviation of the first metatarsal and increase in the intermetatarsal angle supportive bunion deformity on the right, there is inferior calcaneal heel spurs noted bilateral.  There is no significant fracture or dislocation noted.  Assessment and Plan: Problem List Items Addressed This Visit       Nervous and Auditory   Idiopathic progressive neuropathy   Other Visit Diagnoses     Bilateral foot pain    -  Primary   Relevant Orders   DG Foot Complete Right   DG Foot Complete Left   Plantar fasciitis, bilateral       Nail dystrophy             -Complete examination performed -Xrays reviewed -Discussed treatement options for likely neuropathy with bilateral foot pain of Planter fasciitis -Dispensed plantar fascial braces for patient to use as directed and advised patient if works well may benefit long-term from custom insoles versus over-the-counter insoles/power steps -Advised gentle stretching daily as directed -Advised patient to try over-the-counter supplement of Nervive for nerve issues as well as over-the-counter topical pain cream and rub as directed -Advised patient to closely monitor toenails and he may try Vicks VapoRub or tea tree oil to the left hallux -Continue with good supportive shoes daily for foot type -Patient to return to office as scheduled in 1 month or sooner if condition  worsens.  Advised patient that if his symptoms are not improved may benefit from a steroid injection and a further work-up of the source of his neuropathy.  Asencion Islam, DPM

## 2021-04-04 NOTE — Patient Instructions (Signed)
Get OTC Nervive suppliment for nerves Get OTC voltaren gel rub on twice a day for pain to feet Get OTC tea tree oil or vicks vapor rub to use at left 1st toenail

## 2021-04-05 ENCOUNTER — Other Ambulatory Visit: Payer: Self-pay | Admitting: Sports Medicine

## 2021-04-05 DIAGNOSIS — M722 Plantar fascial fibromatosis: Secondary | ICD-10-CM

## 2021-04-06 ENCOUNTER — Other Ambulatory Visit: Payer: Self-pay

## 2021-04-06 ENCOUNTER — Ambulatory Visit
Admission: RE | Admit: 2021-04-06 | Discharge: 2021-04-06 | Disposition: A | Discharge status: Home / Self Care | Payer: Medicare Other | Source: Ambulatory Visit | Attending: Family Medicine | Admitting: Family Medicine

## 2021-04-06 IMAGING — MR MR MRA HEAD W/O CM
1 series · 23 of 48 positions shown · non-contrast
Comparison: No pertinent prior exam.

CLINICAL DATA: Old left thalamic infarct.  Memory loss.  Imbalance.

EXAM:
MRA HEAD WITHOUT CONTRAST
TECHNIQUE: Angiographic images of the Circle of Willis were acquired using MRA
technique without intravenous contrast.

[Series 2: tof_3d_multi-slab new · axial · 0.7mm · 0.35mm/px · z∈[-85,+7]mm · 23 of 143 slices shown]
[im 1/143]
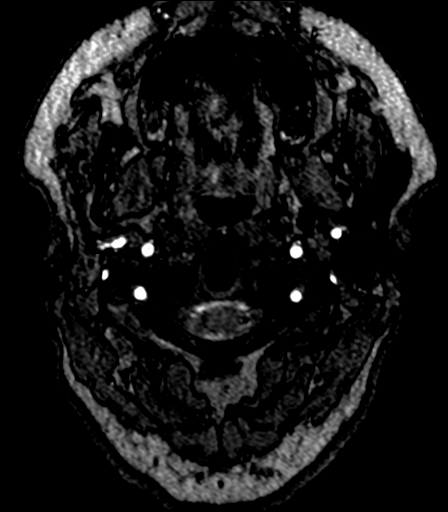
[im 4/143]
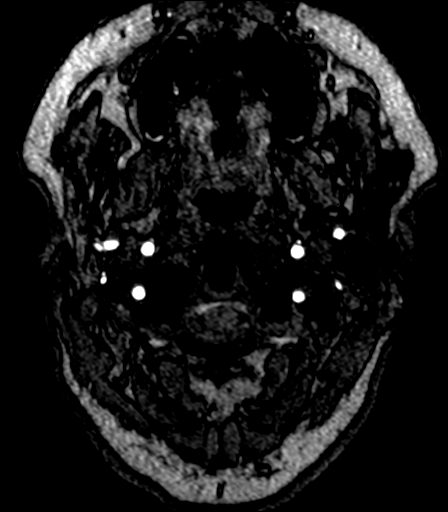
[im 7/143]
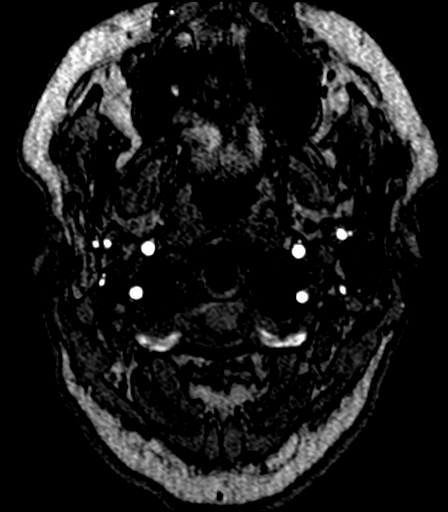
[im 10/143]
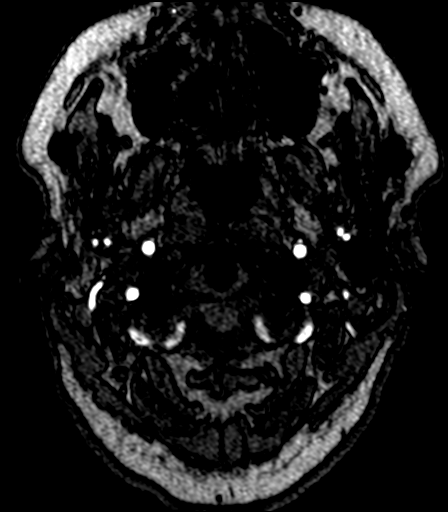
[im 13/143]
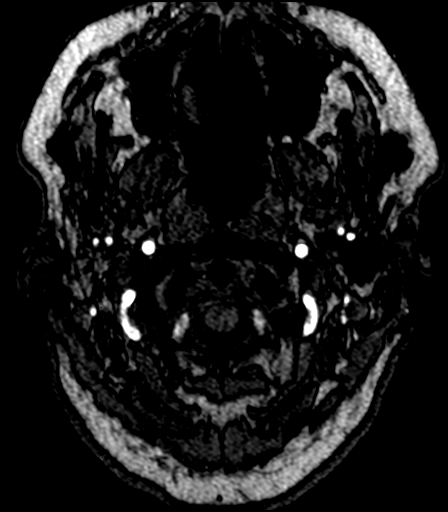
[im 16/143]
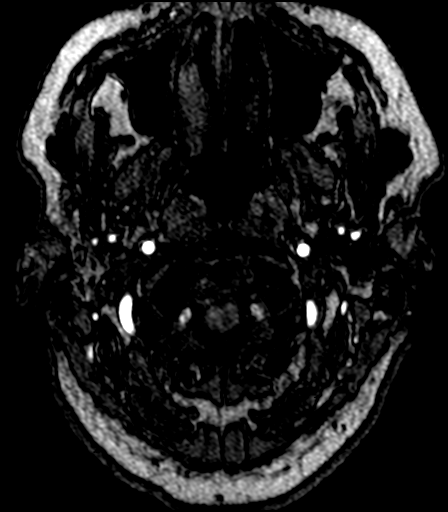
[im 19/143]
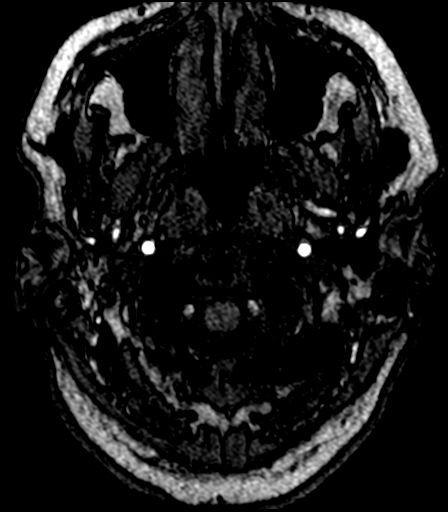
[im 22/143]
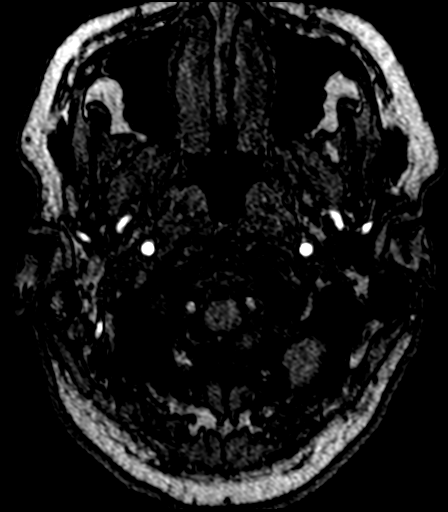
[im 25/143]
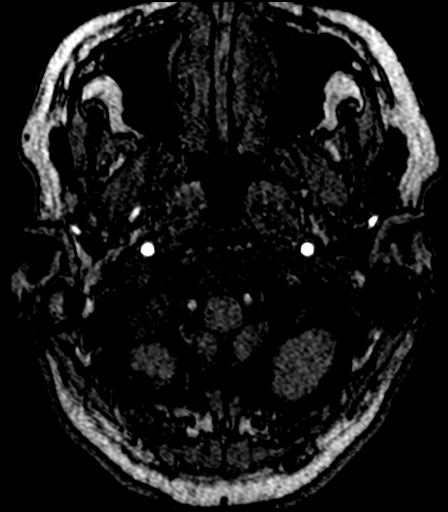
[im 28/143]
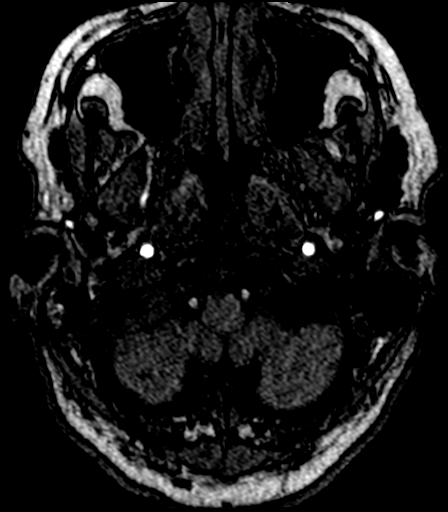
[im 31/143]
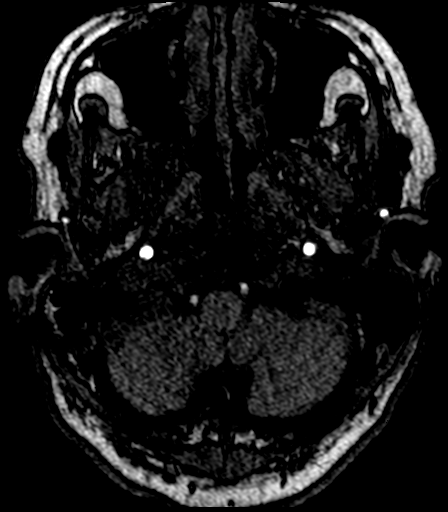
[im 34/143]
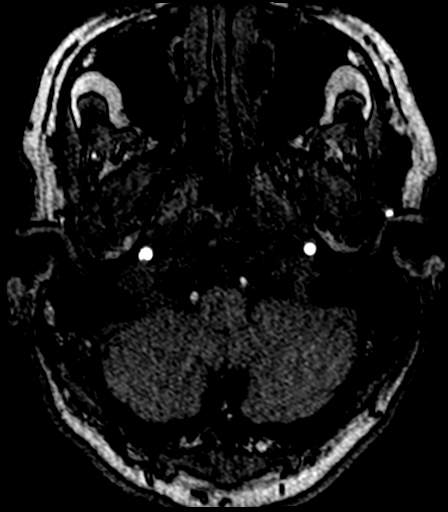
[im 37/143]
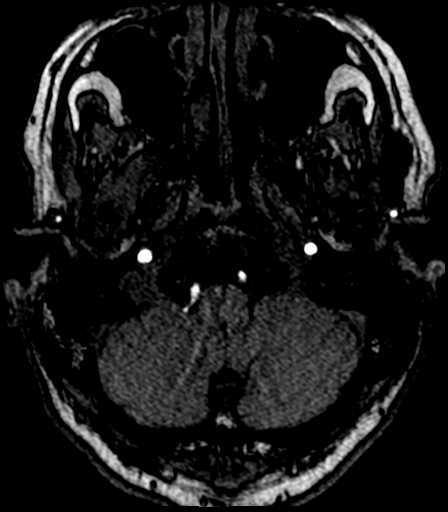
[im 40/143]
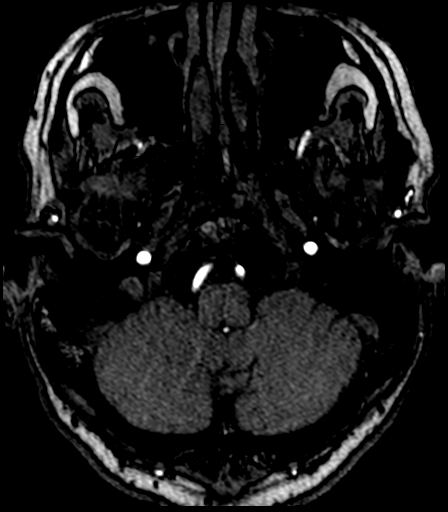
[im 43/143]
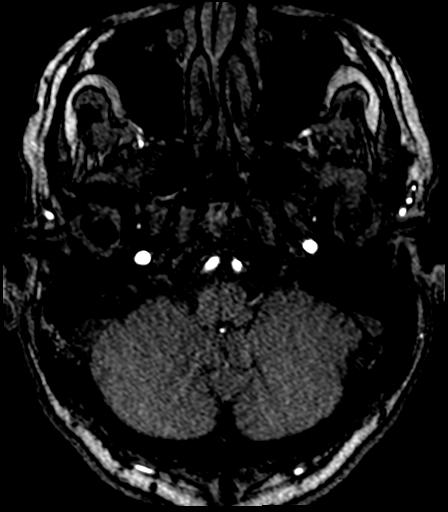
[im 46/143]
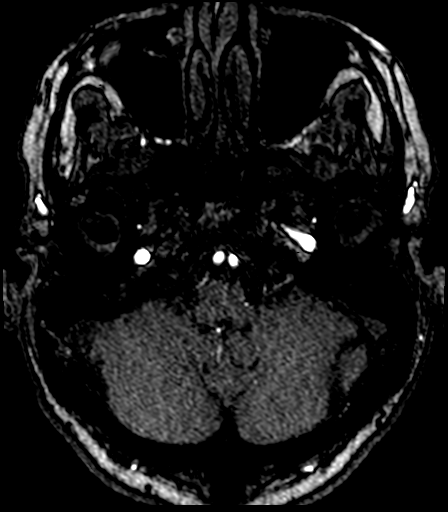
[im 64/143]
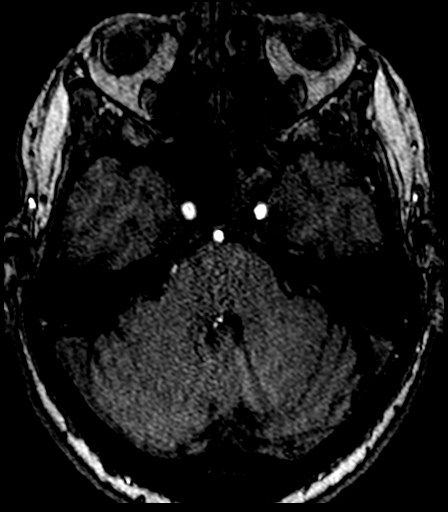
[im 73/143]
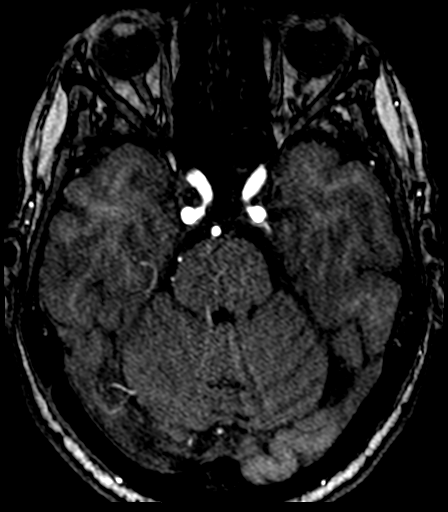
[im 82/143]
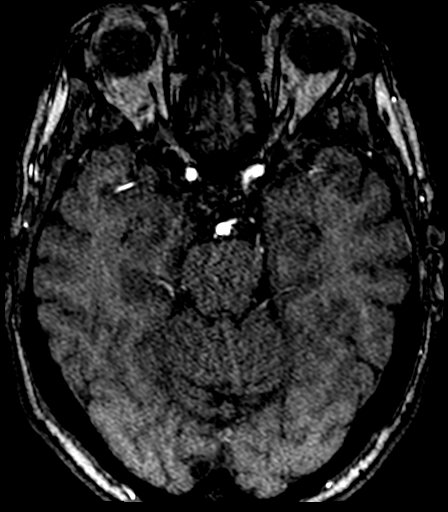
[im 100/143]
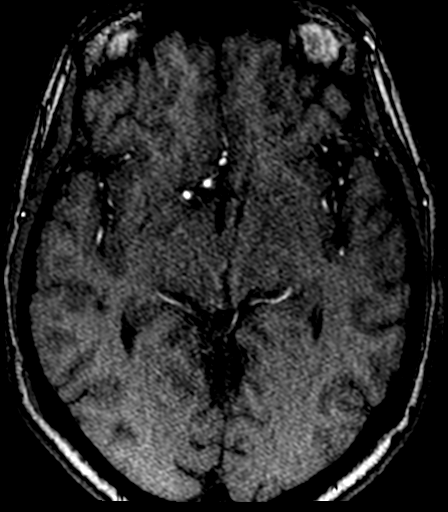
[im 118/143]
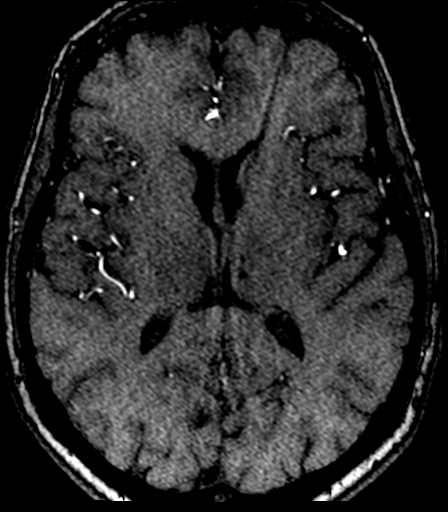
[im 121/143]
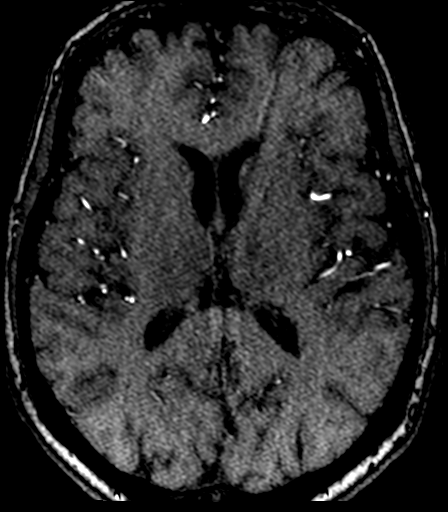
[im 136/143]
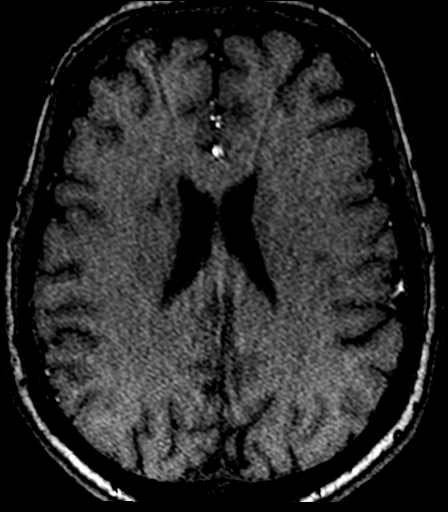

[23 of 48 positions shown; findings below may reference images not displayed]

FINDINGS: Anterior circulation: The internal carotid arteries are widely
patent from skull base to carotid termini. ACAs and MCAs are patent
without evidence of a proximal branch occlusion or significant
proximal stenosis. There is mild motion artifact primarily through
the branch vessels. No aneurysm is identified.

Posterior circulation: The visualized distal vertebral arteries are
widely patent to the basilar and codominant. Patent right PICA,
bilateral AICA, and bilateral SCA origins are visualized. The
basilar artery is widely patent. Posterior communicating arteries
are diminutive or absent. Both PCAs are patent without evidence of a
significant proximal stenosis. No aneurysm is identified.

Anatomic variants: None.
IMPRESSION: Negative head MRA.

## 2021-04-06 IMAGING — MR MR MRA NECK WO/W CM
5 series · 48 of 48 positions shown · IV contrast (multihance)
Comparison: None.

CLINICAL DATA: Old left thalamic infarct. Memory loss. Imbalance.

EXAM:
MRA NECK WITHOUT AND WITH CONTRAST
TECHNIQUE: Multiplanar and multiecho pulse sequences of the neck were obtained
without and with intravenous contrast. Angiographic images of the
neck were obtained using MRA technique without and with intravenous
contrast.
CONTRAST:  20mL MULTIHANCE GADOBENATE DIMEGLUMINE 529 MG/ML IV SOLN

[Series 8: (id) · axial · 3.0mm · 0.98mm/px · z∈[-23,+107]mm · 10 of 66 slices shown]
[im 1/66]
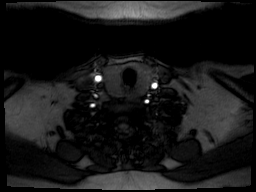
[im 8/66]
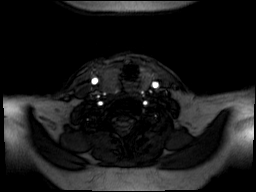
[im 15/66]
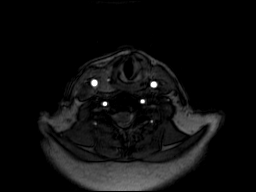
[im 22/66]
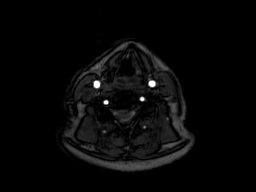
[im 29/66]
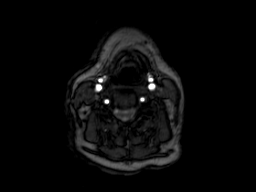
[im 37/66]
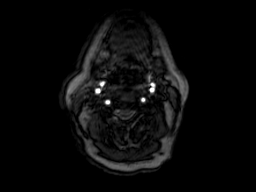
[im 44/66]
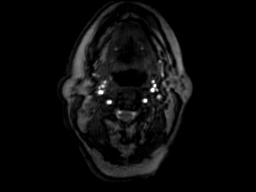
[im 51/66]
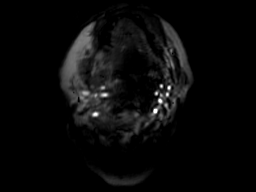
[im 58/66]
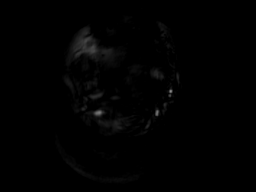
[im 66/66]
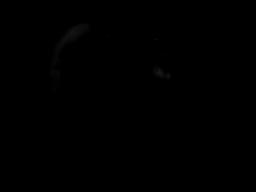

[Series 11: (id)_mip_tra · axial · 133.6mm · 0.98mm/px · 1 of 1 slices shown]
[im 1/1]
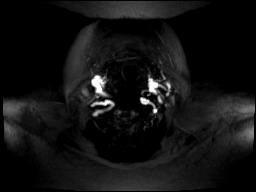

[Series 12: (id)_tt=1.0s · coronal · 0.8mm · 0.78mm/px · 12 of 80 slices shown (1 of 2)]
[im 1/80]
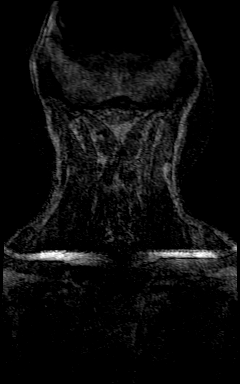
[im 8/80]
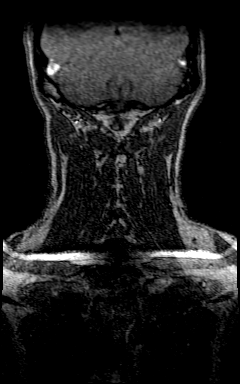
[im 15/80]
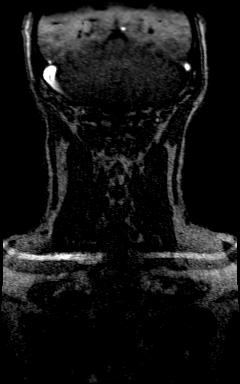
[im 22/80]
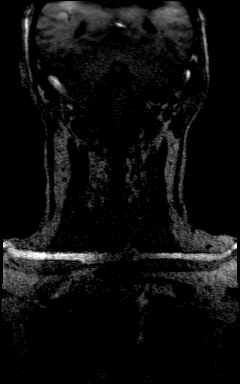
[im 29/80]
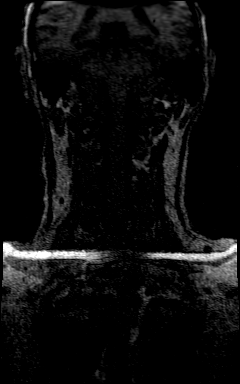
[im 36/80]
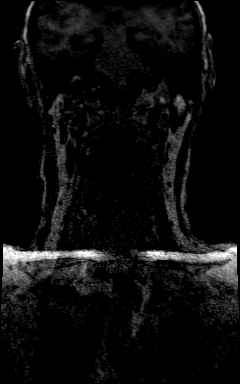
[im 44/80]
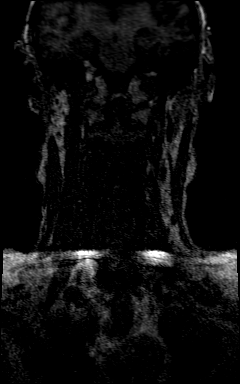
[im 51/80]
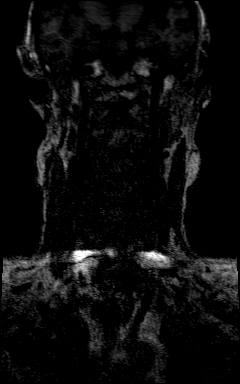
[im 58/80]
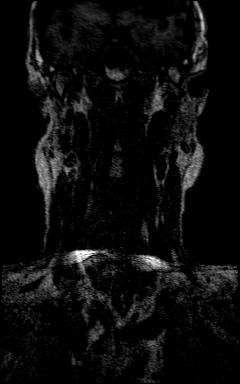
[im 65/80]
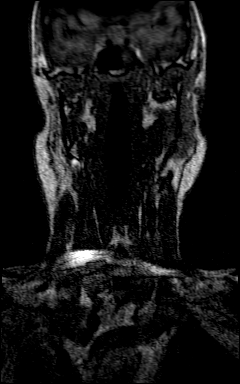
[im 72/80]
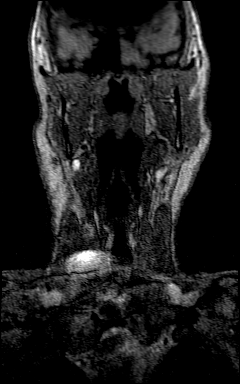
[im 80/80]
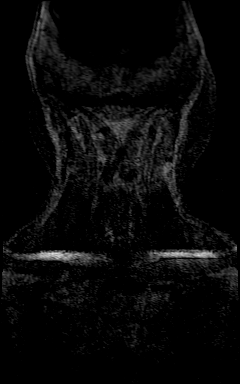

[Series 14: (id)_tt=1.0s · coronal · 0.8mm · 0.78mm/px · 13 of 79 slices shown (2 of 2)]
[im 1/79]
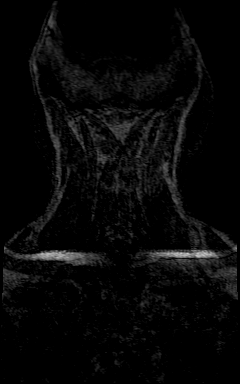
[im 7/79]
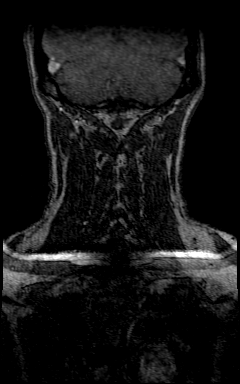
[im 14/79]
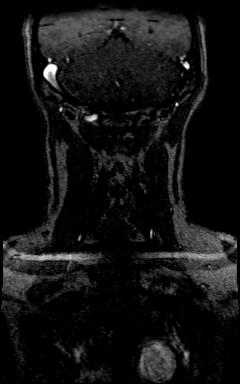
[im 20/79]
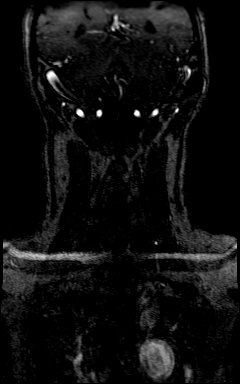
[im 27/79]
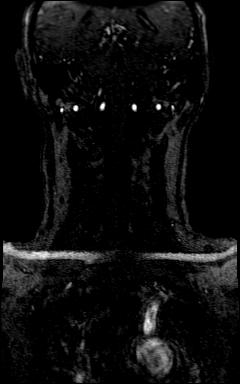
[im 33/79]
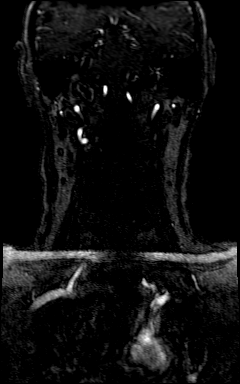
[im 40/79]
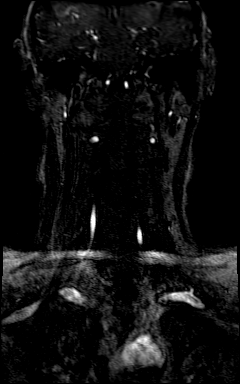
[im 46/79]
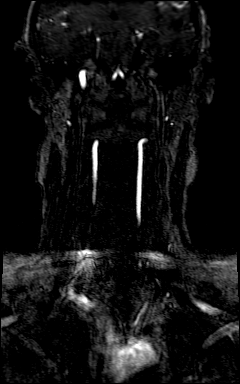
[im 53/79]
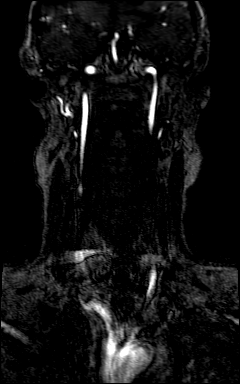
[im 59/79]
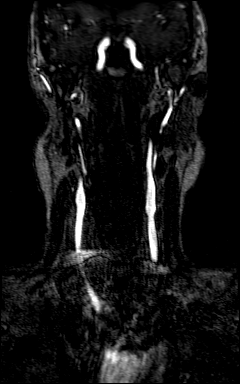
[im 66/79]
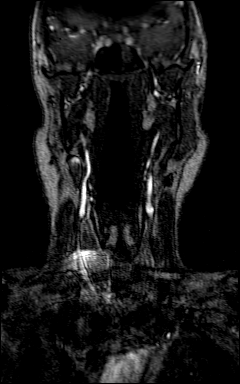
[im 72/79]
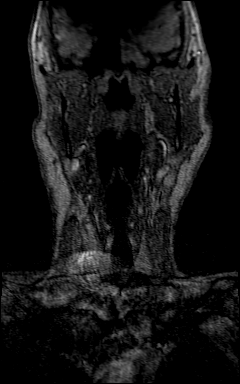
[im 79/79]
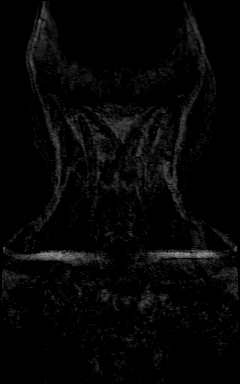

[Series 15: (id)_tt=1.0s_sub · coronal · 0.8mm · 0.78mm/px · 12 of 72 slices shown]
[im 1/72]
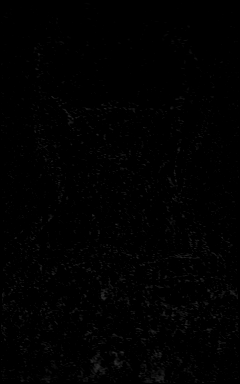
[im 7/72]
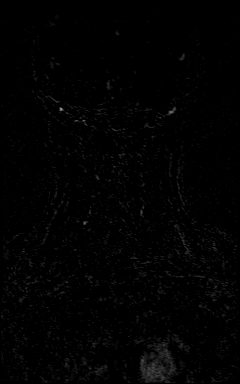
[im 13/72]
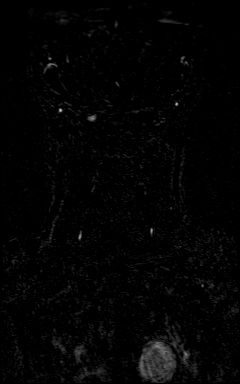
[im 20/72]
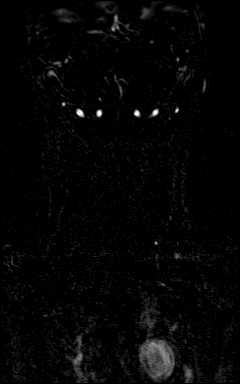
[im 26/72]
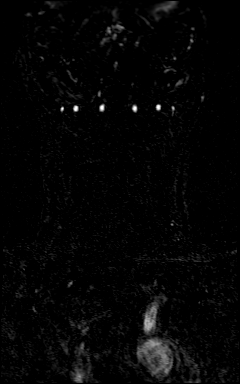
[im 33/72]
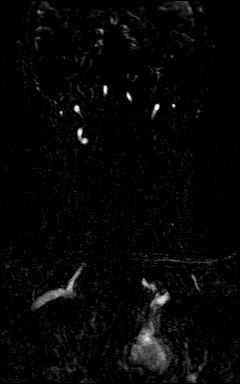
[im 39/72]
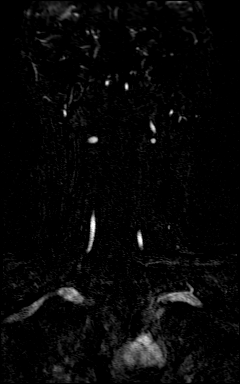
[im 46/72]
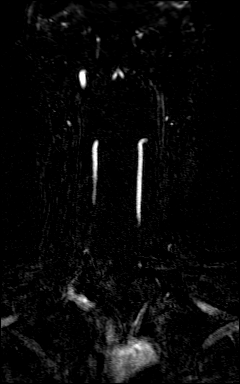
[im 52/72]
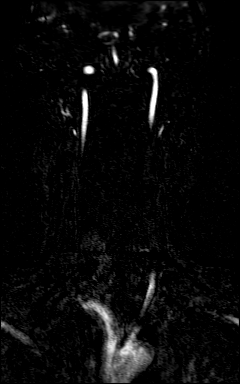
[im 59/72]
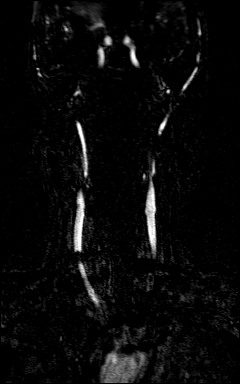
[im 65/72]
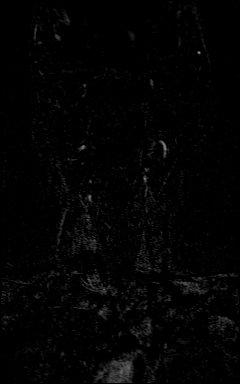
[im 72/72]
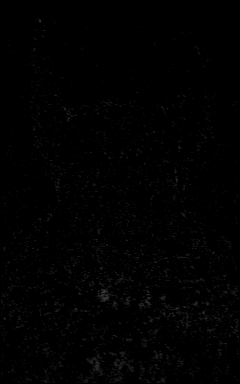

[48 of 48 positions shown; findings below may reference images not displayed]

FINDINGS: There is a standard 3 vessel aortic arch. The brachiocephalic and
subclavian arteries are widely patent.

The common carotid arteries are patent bilaterally with artifact
limiting assessment of their mid portions but without evidence of a
significant stenosis more proximally or distally. The cervical
internal carotid arteries are patent and smooth bilaterally without
evidence of a stenosis or dissection.

The vertebral arteries are patent and codominant with antegrade flow
bilaterally. There is no evidence of a vertebral artery dissection
or significant stenosis within limitations of artifact through the
proximal vertebral arteries.
IMPRESSION: Patent cervical carotid and vertebral arteries without evidence of a
significant stenosis allowing for artifact in the lower neck.

## 2021-04-06 MED ORDER — GADOBENATE DIMEGLUMINE 529 MG/ML IV SOLN
20.0000 mL | Freq: Once | INTRAVENOUS | Status: AC | PRN
  Administered 2021-04-06: 20 mL via INTRAVENOUS

## 2021-04-11 ENCOUNTER — Telehealth: Payer: Self-pay | Admitting: Sports Medicine

## 2021-04-11 NOTE — Telephone Encounter (Signed)
Lm to advise pt of recommendations

## 2021-04-11 NOTE — Telephone Encounter (Signed)
Pt is having excessive swelling in feet only at night.  He wasn't sure if he mentioned this at appt but is wanting to know if there is anything you can recommend.

## 2021-04-17 ENCOUNTER — Ambulatory Visit: Payer: Medicare Other | Admitting: Family Medicine

## 2021-05-04 ENCOUNTER — Other Ambulatory Visit: Payer: Self-pay | Admitting: Physician Assistant

## 2021-05-07 ENCOUNTER — Other Ambulatory Visit: Payer: Self-pay | Admitting: Physician Assistant

## 2021-05-09 ENCOUNTER — Encounter: Payer: Self-pay | Admitting: Sports Medicine

## 2021-05-09 ENCOUNTER — Other Ambulatory Visit: Payer: Self-pay

## 2021-05-09 ENCOUNTER — Ambulatory Visit (INDEPENDENT_AMBULATORY_CARE_PROVIDER_SITE_OTHER): Payer: Medicare Other | Admitting: Sports Medicine

## 2021-05-09 DIAGNOSIS — M722 Plantar fascial fibromatosis: Secondary | ICD-10-CM

## 2021-05-09 DIAGNOSIS — L603 Nail dystrophy: Secondary | ICD-10-CM

## 2021-05-09 DIAGNOSIS — M79671 Pain in right foot: Secondary | ICD-10-CM

## 2021-05-09 DIAGNOSIS — G603 Idiopathic progressive neuropathy: Secondary | ICD-10-CM

## 2021-05-09 DIAGNOSIS — M79672 Pain in left foot: Secondary | ICD-10-CM

## 2021-05-09 MED ORDER — TRIAMCINOLONE ACETONIDE 10 MG/ML IJ SUSP
10.0000 mg | Freq: Once | INTRAMUSCULAR | Status: AC
  Administered 2021-05-09: 10 mg

## 2021-05-09 NOTE — Progress Notes (Signed)
Subjective: Jack Pierce is a 65 y.o. male patient who returns to office for follow up foot pain. Reports pains are little better but still huts in the heels 8/10 sharp pains with swelling in L>R ankles, currently stretching, taking nervive, using braces and compression socks. No other issues noted.     Patient Active Problem List   Diagnosis Date Noted   Lab test positive for detection of COVID-19 virus 07/21/2020   Pharyngitis 04/22/2020   Other sequelae of other cerebrovascular disease    Allergic sinusitis 12/28/2019   Essential hypertension, benign 12/22/2019   Idiopathic progressive neuropathy 12/22/2019   Impaired fasting glucose 12/22/2019   Paresthesias 04/06/2019   Thalamic stroke (HCC) 02/26/2019   Neuropathy involving both lower extremities 10/30/2018   Contracture of tendon sheath 01/07/2018   Plantar fasciitis 01/07/2018   Mixed hyperlipidemia 11/11/2012    Current Outpatient Medications on File Prior to Visit  Medication Sig Dispense Refill   amitriptyline (ELAVIL) 25 MG tablet Take 1 tablet (25 mg total) by mouth at bedtime. (Patient taking differently: Take 25 mg by mouth at bedtime. ) 30 tablet 2   aspirin 81 MG EC tablet Take 81 mg by mouth 2 (two) times daily.     B Complex-Folic Acid (B COMPLEX-VITAMIN B12 PO) Take 1 tablet by mouth daily.     chlorthalidone (HYGROTON) 25 MG tablet Take 1 tablet (25 mg total) by mouth daily. Take 1/2 (one-half) tablet by mouth once daily (Patient taking differently: Take 25 mg by mouth daily.) 90 tablet 0   fluticasone (FLONASE) 50 MCG/ACT nasal spray Place 2 sprays into both nostrils daily. 16 g 6   ibuprofen (ADVIL) 600 MG tablet Take 1 tablet (600 mg total) by mouth 3 (three) times daily. With food 90 tablet 5   lamoTRIgine (LAMICTAL) 100 MG tablet Take 1 tablet by mouth once daily 90 tablet 0   lisinopril (ZESTRIL) 20 MG tablet Take 1 tablet (20 mg total) by mouth daily. 90 tablet 1   Multiple Vitamins-Minerals (ALIVE  MULTI-VITAMIN PO) Take by mouth.     pantoprazole (PROTONIX) 40 MG tablet Take 1 tablet (40 mg total) by mouth 2 (two) times daily. 180 tablet 3   pravastatin (PRAVACHOL) 40 MG tablet TAKE 1 TABLET BY MOUTH AT BEDTIME 90 tablet 0   pregabalin (LYRICA) 25 MG capsule TAKE 1 CAPSULE BY MOUTH THREE TIMES DAILY 90 capsule 0   No current facility-administered medications on file prior to visit.    Allergies  Allergen Reactions   Amlodipine Besylate Swelling   Cozaar [Losartan Potassium] Other (See Comments)    Dizziness   Cefdinir    Meloxicam Other (See Comments)    Dizziness    Objective:  General: Alert and oriented x3 in no acute distress  Dermatology: No open lesions bilateral lower extremities, no webspace macerations, no ecchymosis bilateral, all nails x 10 are well manicured with mild discoloration at the left great toenail and dried blood noted to the right second toenail.  Vascular: Dorsalis Pedis and Posterior Tibial pedal pulses palpable, faintly 1 out of 4 bilateral, capillary Fill Time 5 seconds,(-) pedal hair growth bilateral, trace edema bilateral lower extremities, varicosities bilateral, temperature gradient within normal limits.  Neurology: Gross sensation intact via light touch bilateral, Protective sensation with Phoebe Perch Monofilament to all pedal sites, vibratory intact bilateral, subjective numbness and tingling to all toes and from ankle down.  Musculoskeletal: Mild tenderness with palpation at plantar fascial insertion at the heel arch bilateral.  There  is limited ankle joint range of motion bilateral.  There is cavus deformity with bunion noted at right greater than left.  There is decrease in strength bilateral 4 out of 5.  Assessment and Plan: Problem List Items Addressed This Visit       Nervous and Auditory   Idiopathic progressive neuropathy   Other Visit Diagnoses     Plantar fasciitis, bilateral    -  Primary   Bilateral foot pain       Nail  dystrophy             -Complete examination performed -Re-Discussed treatement options for likely neuropathy with bilateral foot pain of Planter fasciitis -After oral consent and aseptic prep, injected a mixture containing 1 ml of 2%  plain lidocaine, 1 ml 0.5% plain marcaine, 0.5 ml of kenalog 10 and 0.5 ml of dexamethasone phosphate into right and left heels at Plantar fascia insertions without complication. Post-injection care discussed with patient.  -Continue with plantar fascial braces daily -Advised gentle stretching  and icing daily as directed -Continue over-the-counter supplement of Nervive for nerve issues as well as over-the-counter topical pain cream and rub as directed -Advised patient to closely monitor toenails and he may try Vicks VapoRub or tea tree oil to the left hallux and right 5th toe -Continue with good supportive shoes daily for foot type -Patient to return to office as scheduled in 1 month or sooner if condition worsens.    Asencion Islam, DPM

## 2021-06-06 ENCOUNTER — Ambulatory Visit (INDEPENDENT_AMBULATORY_CARE_PROVIDER_SITE_OTHER): Payer: Medicare Other | Admitting: Sports Medicine

## 2021-06-06 ENCOUNTER — Encounter: Payer: Self-pay | Admitting: Sports Medicine

## 2021-06-06 ENCOUNTER — Other Ambulatory Visit: Payer: Self-pay

## 2021-06-06 DIAGNOSIS — G603 Idiopathic progressive neuropathy: Secondary | ICD-10-CM | POA: Diagnosis not present

## 2021-06-06 DIAGNOSIS — M79672 Pain in left foot: Secondary | ICD-10-CM | POA: Diagnosis not present

## 2021-06-06 DIAGNOSIS — M79671 Pain in right foot: Secondary | ICD-10-CM

## 2021-06-06 DIAGNOSIS — M722 Plantar fascial fibromatosis: Secondary | ICD-10-CM

## 2021-06-06 NOTE — Progress Notes (Addendum)
Subjective: Jack Pierce is a 65 y.o. male patient who returns to office for follow up foot pain. Reports pains are little better but still huts in the heels 6/10 sharp pains with swelling in L>R ankles, currently stretching, taking nervive but states that he does not think he started it he is using a topical cream instead, using braces and compression socks. No other issues noted.     Patient Active Problem List   Diagnosis Date Noted   Lab test positive for detection of COVID-19 virus 07/21/2020   Pharyngitis 04/22/2020   Other sequelae of other cerebrovascular disease    Allergic sinusitis 12/28/2019   Essential hypertension, benign 12/22/2019   Idiopathic progressive neuropathy 12/22/2019   Impaired fasting glucose 12/22/2019   Paresthesias 04/06/2019   Thalamic stroke (HCC) 02/26/2019   Neuropathy involving both lower extremities 10/30/2018   Contracture of tendon sheath 01/07/2018   Plantar fasciitis 01/07/2018   Mixed hyperlipidemia 11/11/2012    Current Outpatient Medications on File Prior to Visit  Medication Sig Dispense Refill   amitriptyline (ELAVIL) 25 MG tablet Take 1 tablet (25 mg total) by mouth at bedtime. (Patient taking differently: Take 25 mg by mouth at bedtime. ) 30 tablet 2   aspirin 81 MG EC tablet Take 81 mg by mouth 2 (two) times daily.     B Complex-Folic Acid (B COMPLEX-VITAMIN B12 PO) Take 1 tablet by mouth daily.     chlorthalidone (HYGROTON) 25 MG tablet Take 1 tablet (25 mg total) by mouth daily. Take 1/2 (one-half) tablet by mouth once daily (Patient taking differently: Take 25 mg by mouth daily.) 90 tablet 0   fluticasone (FLONASE) 50 MCG/ACT nasal spray Place 2 sprays into both nostrils daily. 16 g 6   ibuprofen (ADVIL) 600 MG tablet Take 1 tablet (600 mg total) by mouth 3 (three) times daily. With food 90 tablet 5   lamoTRIgine (LAMICTAL) 100 MG tablet Take 1 tablet by mouth once daily 90 tablet 0   lisinopril (ZESTRIL) 20 MG tablet Take 1  tablet (20 mg total) by mouth daily. 90 tablet 1   Multiple Vitamins-Minerals (ALIVE MULTI-VITAMIN PO) Take by mouth.     pantoprazole (PROTONIX) 40 MG tablet Take 1 tablet (40 mg total) by mouth 2 (two) times daily. 180 tablet 3   pravastatin (PRAVACHOL) 40 MG tablet TAKE 1 TABLET BY MOUTH AT BEDTIME 90 tablet 0   pregabalin (LYRICA) 25 MG capsule TAKE 1 CAPSULE BY MOUTH THREE TIMES DAILY 90 capsule 0   No current facility-administered medications on file prior to visit.    Allergies  Allergen Reactions   Amlodipine Besylate Swelling   Cozaar [Losartan Potassium] Other (See Comments)    Dizziness   Cefdinir    Meloxicam Other (See Comments)    Dizziness    Objective:  General: Alert and oriented x3 in no acute distress  Dermatology: No open lesions bilateral lower extremities, no webspace macerations, no ecchymosis bilateral, all nails x 10 are well manicured with mild discoloration at the left great toenail and dried blood noted to the right second toenail unchanged from previous.  Vascular: Dorsalis Pedis and Posterior Tibial pedal pulses palpable, faintly 1 out of 4 bilateral, capillary Fill Time 5 seconds,(-) pedal hair growth bilateral, trace edema bilateral lower extremities, varicosities bilateral, temperature gradient within normal limits.  Neurology: Gross sensation intact via light touch bilateral, Protective sensation with Phoebe Perch Monofilament to all pedal sites, vibratory intact bilateral, subjective numbness and tingling to all toes and  from ankle down and increase numbness and tingling at the fourth and fifth toes on the right feeling like he has on a tight shoe and some occasional spasming and the left foot and ankle going out from under him when he is sleeping in bed at night.  Musculoskeletal: Mild tenderness with palpation at plantar fascial insertion at the heel arch bilateral.  There is limited ankle joint range of motion bilateral.  There is cavus deformity  with bunion noted at right greater than left.  There is decrease in strength bilateral 4 out of 5.  Assessment and Plan: Problem List Items Addressed This Visit       Nervous and Auditory   Idiopathic progressive neuropathy   Other Visit Diagnoses     Plantar fasciitis, bilateral    -  Primary   Bilateral foot pain             -Complete examination performed -Re-Discussed treatement options for likely neuropathy with bilateral foot pain of Planter fasciitis -No reinjection given at this time since pain was less than last visit -Advised gentle stretching  and icing daily as directed -Continue over-the-counter supplement of Nervive for nerve issues as well as over-the-counter topical pain cream and rub as directed; advised patient to get Nervive since he does not recall picking it up yet -Advised patient to be consistent with daily stretching and icing as directed -Continue with good supportive shoes daily for foot type and may try over-the-counter insoles continue with plantar fascial brace and may slowly wean after 1 month as directed -Patient to return if symptoms fail to improve.  Advised patient to call office after 2 weeks if his symptoms are the same for [place] to add on physical therapy order OR get him back in office for steroid shots bilateral.  Asencion Islam, DPM

## 2021-06-06 NOTE — Patient Instructions (Signed)
Nervive nerve relief supplement for your nerves can be purchased OTC at [facility]/walmart  Shoe List For tennis shoes recommend:  Brooks Beast Asics New balance Saucony HOKA Can be purchased at [employer] or [employer] Sketchers arch fit Can be purchased at any major retailers  Vionic  SAS Can be purchased at [employer] or [employer]   For work shoes recommend: Sketchers Work Timberland boots  Redwing boots Can be purchased at a variety of places or [employer]oe Market   For casual shoes recommend: Oofos Can be purchased at [employer] or [employer] Vionic  Can be purchased at [employer] or [employer]   Orthotics  For Over-the-Counter Orthotics recommend: Power Steps Can be purchased in office/[facility] Superfeet Can be purchased at [employer] or [employer] Airplus Can be purchased at [employer] For Custom Orthotics recommend: Custom fitting/Appointment at [facility]  

## 2021-06-12 ENCOUNTER — Other Ambulatory Visit: Payer: Self-pay | Admitting: Family Medicine

## 2021-06-15 ENCOUNTER — Other Ambulatory Visit: Payer: Self-pay

## 2021-06-15 ENCOUNTER — Ambulatory Visit (INDEPENDENT_AMBULATORY_CARE_PROVIDER_SITE_OTHER): Payer: Medicare Other | Admitting: Legal Medicine

## 2021-06-15 ENCOUNTER — Encounter: Payer: Self-pay | Admitting: Legal Medicine

## 2021-06-15 VITALS — BP 112/72 | HR 60 | Temp 97.5°F | Ht 72.0 in | Wt 205.4 lb

## 2021-06-15 DIAGNOSIS — I69898 Other sequelae of other cerebrovascular disease: Secondary | ICD-10-CM

## 2021-06-15 DIAGNOSIS — I6381 Other cerebral infarction due to occlusion or stenosis of small artery: Secondary | ICD-10-CM

## 2021-06-15 DIAGNOSIS — R7301 Impaired fasting glucose: Secondary | ICD-10-CM | POA: Diagnosis not present

## 2021-06-15 DIAGNOSIS — I10 Essential (primary) hypertension: Secondary | ICD-10-CM

## 2021-06-15 DIAGNOSIS — L2489 Irritant contact dermatitis due to other agents: Secondary | ICD-10-CM

## 2021-06-15 DIAGNOSIS — H6590 Unspecified nonsuppurative otitis media, unspecified ear: Secondary | ICD-10-CM | POA: Insufficient documentation

## 2021-06-15 DIAGNOSIS — I639 Cerebral infarction, unspecified: Secondary | ICD-10-CM

## 2021-06-15 DIAGNOSIS — L259 Unspecified contact dermatitis, unspecified cause: Secondary | ICD-10-CM | POA: Insufficient documentation

## 2021-06-15 DIAGNOSIS — M722 Plantar fascial fibromatosis: Secondary | ICD-10-CM

## 2021-06-15 DIAGNOSIS — H6503 Acute serous otitis media, bilateral: Secondary | ICD-10-CM

## 2021-06-15 DIAGNOSIS — E782 Mixed hyperlipidemia: Secondary | ICD-10-CM

## 2021-06-15 DIAGNOSIS — G5793 Unspecified mononeuropathy of bilateral lower limbs: Secondary | ICD-10-CM | POA: Diagnosis not present

## 2021-06-15 MED ORDER — AMOXICILLIN 875 MG PO TABS
875.0000 mg | ORAL_TABLET | Freq: Two times a day (BID) | ORAL | 0 refills | Status: AC
Start: 2021-06-15 — End: 2021-06-25

## 2021-06-15 MED ORDER — TRIAMCINOLONE ACETONIDE 0.1 % EX CREA: 1.0000 "application " | TOPICAL_CREAM | Freq: Two times a day (BID) | CUTANEOUS | 3 refills | Status: DC

## 2021-06-15 NOTE — Progress Notes (Signed)
Established Patient Office Visit  Subjective:  Patient ID: KORTLAND NICHOLS, male    DOB: 13-Jan-1956  Age: 65 y.o. MRN: [id number]  CC:  Chief Complaint  Patient presents with   follow up from specialist    Patient states he went to a foot specialist and wanted to get a closure since "he started out here".    HPI CEDRIC MCCLAINE presents for stroke in thalamus 2012 with then having numbness in feet and some balance issues, he was podiatry and used braces and improved.  He works as a Environmental education officer.  Ices at night and wear braces.  He was turned down but disability and he is angry.  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  He still has to ice the feet at night and wear his braces but he is able to walk short distances and is probably at maximal improvements from all the treatment.  He is upset that he did not get disability.  Past Medical History:  Diagnosis Date   Essential (primary) hypertension    Gastro-esophageal reflux disease without esophagitis    Hyperlipidemia    Idiopathic progressive neuropathy    Impaired fasting glucose    Male erectile disorder    Other sequelae of other cerebrovascular disease    Paresthesias    Plantar fascial fibromatosis     Past Surgical History:  Procedure Laterality Date   [place]   1 removed    Family History  Problem Relation Age of Onset   Heart attack Mother    Coronary artery disease Father    CAD Brother    Suicidality Brother    CAD Sister     Social History   Socioeconomic History   Marital status: Married    Spouse name: Not on file   Number of children: 2   Years of education: Not on file   Highest education level: Not on file  Occupational History   Occupation: Retired  Tobacco Use   Smoking status: Never   Smokeless tobacco: Never  Vaping Use    Vaping Use: Never used  Substance and Sexual Activity   Alcohol use: No   Drug use: No   Sexual activity: Not on file  Other Topics Concern   Not on file  Social History Narrative   Not on file   Social Determinants of Health   Financial Resource Strain: Not on file  Food Insecurity: Not on file  Transportation Needs: Not on file  Physical Activity: Not on file  Stress: Not on file  Social Connections: Not on file  Intimate Partner Violence: Not on file    Outpatient Medications Prior to Visit  Medication Sig Dispense Refill   amitriptyline (ELAVIL) 25 MG tablet Take 1 tablet (25 mg total) by mouth at bedtime. (Patient taking differently: Take 25 mg by mouth at bedtime. ) 30 tablet 2   aspirin 81 MG EC tablet Take 81 mg by mouth 2 (two) times daily.     B Complex-Folic Acid (B COMPLEX-VITAMIN B12 PO) Take 1 tablet by mouth daily.     chlorthalidone (HYGROTON) 25 MG tablet Take 1 tablet (25 mg total) by mouth daily. 90 tablet 1   fluticasone (FLONASE) 50 MCG/ACT nasal spray Place 2 sprays into both nostrils daily. 16 g 6  ibuprofen (ADVIL) 600 MG tablet Take 1 tablet (600 mg total) by mouth 3 (three) times daily. With food 90 tablet 5   lamoTRIgine (LAMICTAL) 100 MG tablet Take 1 tablet by mouth once daily 90 tablet 0   lisinopril (ZESTRIL) 20 MG tablet Take 1 tablet (20 mg total) by mouth daily. 90 tablet 1   Multiple Vitamins-Minerals (ALIVE MULTI-VITAMIN PO) Take by mouth.     pantoprazole (PROTONIX) 40 MG tablet Take 1 tablet (40 mg total) by mouth 2 (two) times daily. 180 tablet 3   pravastatin (PRAVACHOL) 40 MG tablet TAKE 1 TABLET BY MOUTH AT BEDTIME 90 tablet 0   pregabalin (LYRICA) 25 MG capsule TAKE 1 CAPSULE BY MOUTH THREE TIMES DAILY 90 capsule 0   No facility-administered medications prior to visit.    Allergies  Allergen Reactions   Amlodipine Besylate Swelling   Cozaar [Losartan Potassium] Other (See Comments)    Dizziness   Cefdinir    Meloxicam Other (See  Comments)    Dizziness    ROS Review of Systems  Constitutional:  Negative for chills, fatigue and fever.  HENT:  Negative for congestion, ear pain and sore throat.   Respiratory:  Negative for cough and shortness of breath.   Cardiovascular:  Negative for chest pain.  Gastrointestinal:  Negative for abdominal pain, constipation, diarrhea, nausea and vomiting.  Endocrine: Negative for polydipsia, polyphagia and polyuria.  Genitourinary:  Negative for dysuria and frequency.  Musculoskeletal:  Negative for arthralgias and myalgias.       Foot pain/neuropathy bilat  Neurological:  Negative for dizziness and headaches.  Psychiatric/Behavioral:  Negative for dysphoric mood.        No dysphoria     Objective:    Physical Exam Vitals reviewed.  Constitutional:      General: He is not in acute distress.    Appearance: Normal appearance. He is normal weight.  HENT:     Head: Normocephalic.     Right Ear: Tympanic membrane, ear canal and external ear normal.     Left Ear: Tympanic membrane, ear canal and external ear normal.  Eyes:     Extraocular Movements: Extraocular movements intact.     Conjunctiva/sclera: Conjunctivae normal.     Pupils: Pupils are equal, round, and reactive to light.  Cardiovascular:     Rate and Rhythm: Normal rate and regular rhythm.     Pulses: Normal pulses.     Heart sounds: Normal heart sounds. No murmur heard.   No gallop.  Pulmonary:     Effort: Pulmonary effort is normal. No respiratory distress.     Breath sounds: Normal breath sounds. No wheezing.  Abdominal:     General: Abdomen is flat. Bowel sounds are normal. There is no distension.     Tenderness: There is no abdominal tenderness.  Musculoskeletal:        General: Normal range of motion.     Cervical back: Normal range of motion and neck supple.     Right lower leg: No edema.     Left lower leg: No edema.  Skin:    General: Skin is warm.     Capillary Refill: Capillary refill takes  less than 2 seconds.     Findings: Rash (neck and arm) present.  Neurological:     General: No focal deficit present.     Mental Status: He is alert and oriented to person, place, and time. Mental status is at baseline.     Sensory: Sensory deficit (numbess  both feet) present.     Motor: Weakness present.     Gait: Gait abnormal.     Deep Tendon Reflexes: Reflexes normal.     Comments: Positive rhomberg, numbness both feet, has bunions    BP 112/72   Pulse 60   Temp (!) 97.5 F (36.4 C)   Ht 6' (1.829 m)   Wt 205 lb 6.4 oz (93.2 kg)   SpO2 98%   BMI 27.86 kg/m  Wt Readings from Last 3 Encounters:  06/15/21 205 lb 6.4 oz (93.2 kg)  03/13/21 217 lb (98.4 kg)  02/06/21 219 lb 9.6 oz (99.6 kg)     Health Maintenance Due  Topic Date Due   HIV Screening  Never done   Hepatitis C Screening  Never done   INFLUENZA VACCINE  05/22/2021    There are no preventive care reminders to display for this patient.  Lab Results  Component Value Date   TSH 2.850 02/06/2021   Lab Results  Component Value Date   WBC 5.5 03/13/2021   HGB 12.0 (L) 03/13/2021   HCT 35.5 (L) 03/13/2021   MCV 81 03/13/2021   PLT 199 03/13/2021   Lab Results  Component Value Date   NA 136 03/13/2021   K 4.1 03/13/2021   CO2 26 03/13/2021   GLUCOSE 110 (H) 03/13/2021   BUN 15 03/13/2021   CREATININE 1.39 (H) 03/13/2021   BILITOT 0.5 03/13/2021   ALKPHOS 84 03/13/2021   AST 22 03/13/2021   ALT 15 03/13/2021   PROT 7.6 03/13/2021   ALBUMIN 4.7 03/13/2021   CALCIUM 9.6 03/13/2021   EGFR 56 (L) 03/13/2021   Lab Results  Component Value Date   CHOL 143 06/29/2020   Lab Results  Component Value Date   HDL 53 06/29/2020   Lab Results  Component Value Date   LDLCALC 76 06/29/2020   Lab Results  Component Value Date   TRIG 70 06/29/2020   Lab Results  Component Value Date   CHOLHDL 2.7 06/29/2020   Lab Results  Component Value Date   HGBA1C 6.2 (H) 02/06/2021      Assessment &  Plan:   Diagnoses and all orders for this visit: Neuropathy involving both lower extremities Patient has bilateral lower sensory neuropathy in both feet involving fourth and fifth toes mostly.  This is chronic since he has had a stroke in 2012.  He has reached his maximum benefit from therapy and treatment.  Essential hypertension, benign An individual hypertension care plan was established and reinforced today.  The patient's status was assessed using clinical findings on exam and labs or diagnostic tests. The patient's success at meeting treatment goals on disease specific evidence-based guidelines and found to be well controlled. SELF MANAGEMENT: The patient and I together assessed ways to personally work towards obtaining the recommended goals. RECOMMENDATIONS: avoid decongestants found in common cold remedies, decrease consumption of alcohol, perform routine monitoring of BP with home BP cuff, exercise, reduction of dietary salt, take medicines as prescribed, try not to miss doses and quit smoking.  Regular exercise and maintaining a healthy weight is needed.  Stress reduction may help. A CLINICAL SUMMARY including written plan identify barriers to care unique to individual due to social or financial issues.  We attempt to mutually creat solutions for individual and family understanding.   Impaired fasting glucose Patient has prediabetes  Plantar fasciitis Patient has a history of chronic plantar fasciitis bilaterally has been injected multiple times but he still has  pain when he walks for extended period of time after therapy and treatments.  Thalamic stroke [facility]) Patient had a remote thalamic stroke in 2012 which left him weak and with neuropathy in the feet.  Mixed hyperlipidemia AN INDIVIDUAL CARE PLAN for hyperlipidemia/ cholesterol was established and reinforced today.  The patient's status was assessed using clinical findings on exam, lab and other diagnostic tests. The patient's  disease status was assessed based on evidence-based guidelines and found to be fair controlled. MEDICATIONS were reviewed. SELF MANAGEMENT GOALS have been discussed and patient's success at attaining the goal of low cholesterol was assessed. RECOMMENDATION given include regular exercise 3 days a week and low cholesterol/low fat diet. CLINICAL SUMMARY including written plan to identify barriers unique to the patient due to social or economic  reasons was discussed.   Other sequelae of other cerebrovascular disease  Thalamic stroke in 2012 leaving him weak in his lower extremities especially on the right side with falls frequent he also had bilateral foot pain peripheral neuropathy and he has been treated with podiatry and physical therapy extended time since then.  He has had a stable point and probably will not improve from this point from the stroke.  Non-recurrent acute serous otitis media of both ears -     amoxicillin (AMOXIL) 875 MG tablet; Take 1 tablet (875 mg total) by mouth 2 (two) times daily for 10 days. Patient has serous otititis media  Irritant contact dermatitis due to other agents -     triamcinolone cream (KENALOG) 0.1 %; Apply 1 application topically 2 (two) times daily.  Use triamcinolone cream for arm rash  Extensive review of old records and exam > 30 minutes  Follow-up: Return in about 3 months (around 09/15/2021) for Dr. Tobie Poet.    Reinaldo Meeker, MD

## 2021-07-10 ENCOUNTER — Ambulatory Visit (INDEPENDENT_AMBULATORY_CARE_PROVIDER_SITE_OTHER): Payer: Medicare Other | Admitting: Nurse Practitioner

## 2021-07-10 ENCOUNTER — Other Ambulatory Visit: Payer: Self-pay

## 2021-07-10 ENCOUNTER — Other Ambulatory Visit: Payer: Self-pay | Admitting: Nurse Practitioner

## 2021-07-10 ENCOUNTER — Encounter: Payer: Self-pay | Admitting: Nurse Practitioner

## 2021-07-10 VITALS — BP 122/88 | HR 66 | Temp 98.2°F | Ht 72.0 in | Wt 211.0 lb

## 2021-07-10 DIAGNOSIS — R531 Weakness: Secondary | ICD-10-CM | POA: Diagnosis not present

## 2021-07-10 DIAGNOSIS — R55 Syncope and collapse: Secondary | ICD-10-CM

## 2021-07-10 DIAGNOSIS — E782 Mixed hyperlipidemia: Secondary | ICD-10-CM

## 2021-07-10 DIAGNOSIS — H9201 Otalgia, right ear: Secondary | ICD-10-CM

## 2021-07-10 DIAGNOSIS — Z8673 Personal history of transient ischemic attack (TIA), and cerebral infarction without residual deficits: Secondary | ICD-10-CM

## 2021-07-10 DIAGNOSIS — R001 Bradycardia, unspecified: Secondary | ICD-10-CM

## 2021-07-10 DIAGNOSIS — I10 Essential (primary) hypertension: Secondary | ICD-10-CM | POA: Diagnosis not present

## 2021-07-10 DIAGNOSIS — I9589 Other hypotension: Secondary | ICD-10-CM

## 2021-07-10 DIAGNOSIS — H6983 Other specified disorders of Eustachian tube, bilateral: Secondary | ICD-10-CM

## 2021-07-10 MED ORDER — CIPROFLOXACIN-DEXAMETHASONE 0.3-0.1 % OT SUSP: 4.0000 [drp] | Freq: Two times a day (BID) | OTIC | 0 refills | Status: DC

## 2021-07-10 MED ORDER — PREDNISONE 20 MG PO TABS: 20.0000 mg | ORAL_TABLET | Freq: Every day | ORAL | 0 refills | Status: DC

## 2021-07-10 NOTE — Patient Instructions (Addendum)
Take Prednisone as directed: Prednisone 20 mg three times daily for 3 days, then Prednisone 20 mg twice daily for 3 days, then Prednisone 20 mg once daily for 3 days Ciprodex otic drops to right ear twice daily for 5 days We will call you with referral to cardiology Seek emergency medical care for any syncope (passing out) or any other serious concerns  Follow-up as needed, pending cardiology work-up  Eustachian Tube Dysfunction Eustachian tube dysfunction refers to a condition in which a blockage develops in the narrow passage that connects the middle ear to the back of the nose (eustachian tube). The eustachian tube regulates air pressure in the middle ear by letting air move between the ear and nose. It also helps to drain fluid from the middle ear space. Eustachian tube dysfunction can affect one or both ears. When the eustachian tube does not function properly, air pressure, fluid, or both can build up in the middle ear. What are the causes? This condition occurs when the eustachian tube becomes blocked or cannot open normally. Common causes of this condition  include: Ear infections. Colds and other infections that affect the nose, mouth, and throat (upper respiratory tract). Allergies. Irritation from cigarette smoke. Irritation from stomach acid coming up into the esophagus (gastroesophageal reflux). The esophagus is the part of the body that moves food from the mouth to the stomach. Sudden changes in air pressure, such as from descending in an airplane or scuba diving. Abnormal growths in the nose or throat, such as: Growths that line the nose (nasal polyps). Abnormal growth of cells (tumors). Enlarged tissue at the back of the throat (adenoids). What increases the risk? You are more likely to develop this condition if: You smoke. You are overweight. You are a child who has: Certain birth defects of the mouth, such as cleft palate. Large tonsils or adenoids. What are the signs or symptoms? Common symptoms of this condition include: A feeling of fullness in the ear. Ear pain. Clicking or popping noises in the ear. Ringing in the ear (tinnitus). Hearing loss. Loss of balance. Dizziness. Symptoms may get worse when the air pressure around you changes, such as when you travel to an area of high elevation, fly on an airplane, or go scuba diving. How is this diagnosed? This condition may be diagnosed based on: Your symptoms. A physical exam of your ears, nose, and throat. Tests, such as those that measure: The movement of your eardrum. Your hearing (audiometry). How is this treated? Treatment depends on the cause and severity of your condition. In mild cases, you may relieve your symptoms by moving air into your ears. This is called "popping the ears." In more severe cases, or if you have symptoms of fluid in your ears, treatment may include: Medicines to relieve congestion (decongestants). Medicines that treat allergies (antihistamines). Nasal sprays or ear drops that contain medicines that reduce swelling (steroids). A procedure  to drain the fluid in your eardrum. In this procedure, a small tube may be placed in the eardrum to: Drain the fluid. Restore the air in the middle ear space. A procedure to insert a balloon device through the nose to inflate the opening of the eustachian tube (balloon dilation). Follow these instructions at home: Lifestyle Do not do any of the following until your health care provider approves: Travel to high altitudes. Fly in airplanes. Work in a Estate agent or room. Scuba dive. Do not use any products that contain nicotine or tobacco. These products include cigarettes, chewing tobacco, and vaping devices, such as e-cigarettes. If you need help quitting, ask your health care provider. Keep your ears dry. Wear fitted earplugs during showering and bathing. Dry your ears completely after. General instructions Take over-the-counter and prescription medicines only as told by your health care provider. Use techniques to help pop your ears as recommended by your health care provider. These may include: Chewing gum. Yawning. Frequent, forceful swallowing.  Closing your mouth, holding your nose closed, and gently blowing as if you are trying to blow air out of your nose. Keep all follow-up visits. This is important. Contact a health care provider if: Your symptoms do not go away after treatment. Your symptoms come back after treatment. You are unable to pop your ears. You have: A fever. Pain in your ear. Pain in your head or neck. Fluid draining from your ear. Your hearing suddenly changes. You become very dizzy. You lose your balance. Get help right away if: You have a sudden, severe increase in any of your symptoms. Summary Eustachian tube dysfunction refers to a condition in which a blockage develops in the eustachian tube. It can be caused by ear infections, allergies, inhaled irritants, or abnormal growths in the nose or throat. Symptoms may include ear pain or fullness,  hearing loss, or ringing in the ears. Mild cases are treated with techniques to unblock the ears, such as yawning or chewing gum. More severe cases are treated with medicines or procedures. This information is not intended to replace advice given to you by your health care provider. Make sure you discuss any questions you have with your health care provider. Document Revised: 12/19/2020 Document Reviewed: 12/19/2020 Elsevier Patient Education  2022 [employer]    Near-Syncope Near-syncope is when you suddenly get weak or dizzy, or you feel like you might pass out (faint). This may also be called presyncope. This is due to a lack of blood flow to the brain. During an episode of near-syncope, you may: Feel dizzy, weak, or light-headed. Feel sick to your stomach (nauseous). See all white or all black. See spots. Have cold, clammy skin. This condition is caused by a sudden decrease in blood flow to the brain. This decrease can result from various causes, but most of those causes are not dangerous. However, near-syncope may be a sign of a serious medical problem, so it is important to seek medical care. Follow these instructions at home: Medicines Take over-the-counter and prescription medicines only as told by your doctor. If you are taking blood pressure or heart medicine, get up slowly and spend many minutes getting ready to sit and then stand. This can help with dizziness. General instructions Be aware of any changes in your symptoms. Talk with your doctor about your symptoms. You may need to have testing to find the cause of your near-syncope. If you start to feel like you might pass out, lie down right away. Raise (elevate) your feet above the level of your heart. Breathe deeply and steadily. Wait until all of the symptoms are gone. Have someone stay with you until you feel stable. Do not drive, use machinery, or play sports until your doctor says it is okay. Drink enough fluid to keep  your pee (urine) pale yellow. Keep all follow-up visits as told by your doctor. This is important. Get help right away if you: Have a seizure. Have pain in your: Chest. Belly (abdomen). Back. Faint once or more than once. Have a very bad headache. Are bleeding from your mouth or butt. Have black or tarry poop (stool). Have a very fast or uneven heartbeat (palpitations). Are mixed up (confused). Have trouble walking. Are very weak. Have trouble seeing. These symptoms may be an emergency. Do not wait to see if the symptoms will go away. Get medical help right away. Call your local emergency services (911 in the [place] Do not drive yourself to the hospital. Summary Near-syncope is when  you suddenly get weak or dizzy, or you feel like you might pass out (faint). This condition is caused by a lack of blood flow to the brain. Near-syncope may be a sign of a serious medical problem, so it is important to seek medical care. This information is not intended to replace advice given to you by your health care provider. Make sure you discuss any questions you have with your health care provider. Document Revised: 01/30/2019 Document Reviewed: 08/27/2018 Elsevier Patient Education  2022 [employer].

## 2021-07-10 NOTE — Progress Notes (Signed)
Acute Office Visit  Subjective:    Patient ID: MERION GRIMALDO, male    DOB: 1956/05/01, 65 y.o.   MRN: [id number]  Chief Complaint  Patient presents with   Hypotension        HPI Patient is in today for near-syncopal episode yesterday at church. Denies previous similar episodes. He tells me that he had eaten lunch prior to episode. States he abruptly became lightheaded, dizzy, diaphoretic, and nauseous. He tells me his spouse drove him home. BP checked by his spouse was found to be 93//52.  EMS was called. EMS obtained EKG-NSR, blood glucose 132, and Repeat BP 97/53, pulse 59. Aundrey denies experiencing chest pain, dyspnea, or vomiting. He has a past medical history of CVA, hypertension, and hyperlipidemia. Demonta tells me he has right ear tenderness and fullness. States he is experiencing left ear fullness and popping.   Past Medical History:  Diagnosis Date   Essential (primary) hypertension    Gastro-esophageal reflux disease without esophagitis    Hyperlipidemia    Idiopathic progressive neuropathy    Impaired fasting glucose    Male erectile disorder    Other sequelae of other cerebrovascular disease    Paresthesias    Plantar fascial fibromatosis     Past Surgical History:  Procedure Laterality Date   [place]   1 removed    Family History  Problem Relation Age of Onset   Heart attack Mother    Coronary artery disease Father    CAD Brother    Suicidality Brother    CAD Sister     Social History   Socioeconomic History   Marital status: Married    Spouse name: Not on file   Number of children: 2   Years of education: Not on file   Highest education level: Not on file  Occupational History   Occupation: Retired  Tobacco Use   Smoking status: Never   Smokeless tobacco: Never  Vaping Use   Vaping Use: Never used  Substance and Sexual Activity   Alcohol use: No   Drug use: No   Sexual activity: Not on file   Other Topics Concern   Not on file  Social History Narrative   Not on file   Social Determinants of Health   Financial Resource Strain: Not on file  Food Insecurity: Not on file  Transportation Needs: Not on file  Physical Activity: Not on file  Stress: Not on file  Social Connections: Not on file  Intimate Partner Violence: Not on file    Outpatient Medications Prior to Visit  Medication Sig Dispense Refill   amitriptyline (ELAVIL) 25 MG tablet Take 1 tablet (25 mg total) by mouth at bedtime. (Patient taking differently: Take 25 mg by mouth at bedtime.) 30 tablet 2   aspirin 81 MG EC tablet Take 81 mg by mouth 2 (two) times daily.     B Complex-Folic Acid (B COMPLEX-VITAMIN B12 PO) Take 1 tablet by mouth daily.     chlorthalidone (HYGROTON) 25 MG tablet Take 1 tablet (25 mg total) by mouth daily. 90 tablet 1   fluticasone (FLONASE) 50 MCG/ACT nasal spray Place 2 sprays into both nostrils daily. 16 g 6   ibuprofen (ADVIL) 600 MG tablet Take 1 tablet (600 mg total) by mouth 3 (three) times daily. With food 90 tablet 5   lamoTRIgine (LAMICTAL) 100 MG tablet Take 1 tablet by mouth once daily 90 tablet 0  lisinopril (ZESTRIL) 20 MG tablet Take 1 tablet (20 mg total) by mouth daily. 90 tablet 1   Multiple Vitamins-Minerals (ALIVE MULTI-VITAMIN PO) Take by mouth.     pantoprazole (PROTONIX) 40 MG tablet Take 1 tablet (40 mg total) by mouth 2 (two) times daily. 180 tablet 3   pravastatin (PRAVACHOL) 40 MG tablet TAKE 1 TABLET BY MOUTH AT BEDTIME 90 tablet 0   pregabalin (LYRICA) 25 MG capsule TAKE 1 CAPSULE BY MOUTH THREE TIMES DAILY 90 capsule 0   triamcinolone cream (KENALOG) 0.1 % Apply 1 application topically 2 (two) times daily. 30 g 3   No facility-administered medications prior to visit.    Allergies  Allergen Reactions   Amlodipine Besylate Swelling   Cozaar [Losartan Potassium] Other (See Comments)    Dizziness   Cefdinir    Meloxicam Other (See Comments)    Dizziness     Review of Systems  Constitutional:  Negative for appetite change, fatigue and fever.  HENT:  Positive for ear pain (right ear pain, bilateral pressure/fullness). Negative for congestion and sore throat.   Eyes: Negative.   Respiratory:  Negative for cough and shortness of breath.   Cardiovascular:  Negative for chest pain and leg swelling.  Gastrointestinal:  Positive for nausea. Negative for abdominal pain, constipation, diarrhea and vomiting.  Endocrine: Negative.   Genitourinary:  Negative for dysuria and frequency.  Musculoskeletal:  Negative for arthralgias and myalgias.  Skin: Negative.   Allergic/Immunologic: Positive for environmental allergies.  Neurological:  Positive for dizziness, syncope (near syncope), weakness and light-headedness. Negative for headaches.  Hematological: Negative.   Psychiatric/Behavioral:  Negative for dysphoric mood. The patient is not nervous/anxious.       Objective:    Physical Exam Vitals reviewed.  Constitutional:      Appearance: Normal appearance.  HENT:     Right Ear: Tympanic membrane and external ear normal. Tenderness present.     Left Ear: Tympanic membrane and external ear normal.     Nose: Nose normal.     Mouth/Throat:     Mouth: Mucous membranes are moist.  Eyes:     Pupils: Pupils are equal, round, and reactive to light.  Cardiovascular:     Rate and Rhythm: Bradycardia present. Rhythm regularly irregular.     Pulses: Normal pulses.     Heart sounds: Normal heart sounds.  Pulmonary:     Breath sounds: Normal breath sounds.  Abdominal:     General: Abdomen is flat. Bowel sounds are normal.     Palpations: Abdomen is soft.  Musculoskeletal:        General: Normal range of motion.     Cervical back: Normal range of motion.  Skin:    General: Skin is warm and dry.  Neurological:     Mental Status: He is alert and oriented to person, place, and time.  Psychiatric:        Mood and Affect: Mood normal.        Behavior:  Behavior normal.    BP 124/78 (BP Location: Left Arm, Patient Position: Sitting)   Pulse (!) 57   Temp 98.2 F (36.8 C) (Temporal)   Ht 6' (1.829 m)   Wt 211 lb (95.7 kg)   SpO2 99%   BMI 28.62 kg/m  Wt Readings from Last 3 Encounters:  07/10/21 211 lb (95.7 kg)  06/15/21 205 lb 6.4 oz (93.2 kg)  03/13/21 217 lb (98.4 kg)    Health Maintenance Due  Topic Date Due  HIV Screening  Never done   Hepatitis C Screening  Never done   INFLUENZA VACCINE  05/22/2021       Lab Results  Component Value Date   TSH 2.850 02/06/2021   Lab Results  Component Value Date   WBC 5.5 03/13/2021   HGB 12.0 (L) 03/13/2021   HCT 35.5 (L) 03/13/2021   MCV 81 03/13/2021   PLT 199 03/13/2021   Lab Results  Component Value Date   NA 136 03/13/2021   K 4.1 03/13/2021   CO2 26 03/13/2021   GLUCOSE 110 (H) 03/13/2021   BUN 15 03/13/2021   CREATININE 1.39 (H) 03/13/2021   BILITOT 0.5 03/13/2021   ALKPHOS 84 03/13/2021   AST 22 03/13/2021   ALT 15 03/13/2021   PROT 7.6 03/13/2021   ALBUMIN 4.7 03/13/2021   CALCIUM 9.6 03/13/2021   EGFR 56 (L) 03/13/2021   Lab Results  Component Value Date   CHOL 143 06/29/2020   Lab Results  Component Value Date   HDL 53 06/29/2020   Lab Results  Component Value Date   LDLCALC 76 06/29/2020   Lab Results  Component Value Date   TRIG 70 06/29/2020   Lab Results  Component Value Date   CHOLHDL 2.7 06/29/2020   Lab Results  Component Value Date   HGBA1C 6.2 (H) 02/06/2021         Assessment & Plan:   1. Bradycardia - EKG 12-Lead - Ambulatory referral to Cardiology  2. Essential hypertension, benign - EKG 12-Lead - Ambulatory referral to Cardiology  3. Mixed hyperlipidemia - EKG 12-Lead - Ambulatory referral to Cardiology  4. Near syncope - Orthostatic vital signs - EKG 12-Lead - Ambulatory referral to Cardiology  5. Generalized weakness - Orthostatic vital signs - EKG 12-Lead - Ambulatory referral to  Cardiology  6. Other specified hypotension - Orthostatic vital signs - EKG 12-Lead - Ambulatory referral to Cardiology  7. Dysfunction of both eustachian tubes - predniSONE (DELTASONE) 20 MG tablet; Take 1 tablet (20 mg total) by mouth daily with breakfast. 1 po tid for 3 days then 1 po bid for 3 days then 1 po qd for 3 days  Dispense: 18 tablet; Refill: 0  8. Otalgia of right ear -Tylenol/Ibuprofen as directed  9. History of CVA (cerebrovascular accident) - Ambulatory referral to Cardiology   Take Prednisone as directed: Prednisone 20 mg three times daily for 3 days, then Prednisone 20 mg twice daily for 3 days, then Prednisone 20 mg once daily for 3 days Ciprodex otic drops to right ear twice daily for 5 days We will call you with referral to cardiology Seek emergency medical care for any syncope (passing out) or any other serious concerns  Follow-up as needed, pending cardiology work-up                     I,Lauren M Auman,acting as a scribe for [employer], NP.,have documented all relevant documentation on the behalf of Rip Harbour, NP,as directed by  Rip Harbour, NP while in the presence of Rip Harbour, NP.   I, Rip Harbour, NP, have reviewed all documentation for this visit. The documentation on 07/10/21 for the exam, diagnosis, procedures, and orders are all accurate and complete.   Follow-up: Pending cardiology work-up   Signed, Jerrell Belfast, DNP 07/10/21 at 2:21 PM

## 2021-07-11 ENCOUNTER — Other Ambulatory Visit: Payer: Self-pay | Admitting: Nurse Practitioner

## 2021-07-11 ENCOUNTER — Telehealth: Payer: Self-pay

## 2021-07-11 DIAGNOSIS — H6502 Acute serous otitis media, left ear: Secondary | ICD-10-CM

## 2021-07-11 MED ORDER — NEOMYCIN-POLYMYXIN-HC 3.5-10000-1 OT SOLN: 4.0000 [drp] | Freq: Three times a day (TID) | OTIC | 0 refills | Status: DC

## 2021-07-11 NOTE — Telephone Encounter (Signed)
Pt's wife calling states pt was given prednisone and pt is not sleeping. Knows this is side effect and states "but he cant take it he needs something different he's not sleeping." Advised would send message to encounter provider for advisement.   Lorita Officer, [place] 07/11/21 8:44 AM

## 2021-07-11 NOTE — Telephone Encounter (Signed)
Called wife. Wife VU.   Jack Pierce, [place] 07/11/21 4:12 PM

## 2021-07-19 ENCOUNTER — Other Ambulatory Visit: Payer: Self-pay | Admitting: Family Medicine

## 2021-08-02 ENCOUNTER — Other Ambulatory Visit: Payer: Self-pay | Admitting: Family Medicine

## 2021-08-02 DIAGNOSIS — G603 Idiopathic progressive neuropathy: Secondary | ICD-10-CM

## 2021-08-07 DIAGNOSIS — K219 Gastro-esophageal reflux disease without esophagitis: Secondary | ICD-10-CM | POA: Insufficient documentation

## 2021-08-07 DIAGNOSIS — N529 Male erectile dysfunction, unspecified: Secondary | ICD-10-CM | POA: Insufficient documentation

## 2021-08-08 ENCOUNTER — Other Ambulatory Visit: Payer: Self-pay | Admitting: Family Medicine

## 2021-08-08 NOTE — Telephone Encounter (Signed)
Refill sent to pharmacy.   

## 2021-08-14 ENCOUNTER — Ambulatory Visit: Payer: Medicare Other | Admitting: Cardiology

## 2021-08-24 ENCOUNTER — Other Ambulatory Visit: Payer: Self-pay | Admitting: Family Medicine

## 2021-08-24 NOTE — Telephone Encounter (Signed)
Refill sent to pharmacy.   

## 2021-09-07 ENCOUNTER — Other Ambulatory Visit: Payer: Self-pay

## 2021-09-07 ENCOUNTER — Telehealth: Payer: Self-pay | Admitting: Sports Medicine

## 2021-09-07 ENCOUNTER — Ambulatory Visit (INDEPENDENT_AMBULATORY_CARE_PROVIDER_SITE_OTHER): Payer: Medicare Other

## 2021-09-07 DIAGNOSIS — Z23 Encounter for immunization: Secondary | ICD-10-CM | POA: Diagnosis not present

## 2021-09-07 NOTE — Telephone Encounter (Signed)
Lm for pt to pk up/reb

## 2021-09-07 NOTE — Telephone Encounter (Signed)
Pt is req handicap placard due to neuropathy/pf

## 2021-09-10 ENCOUNTER — Other Ambulatory Visit: Payer: Self-pay | Admitting: Family Medicine

## 2021-09-19 DIAGNOSIS — R079 Chest pain, unspecified: Secondary | ICD-10-CM | POA: Insufficient documentation

## 2021-09-22 ENCOUNTER — Other Ambulatory Visit: Payer: Self-pay

## 2021-09-22 MED ORDER — LISINOPRIL 20 MG PO TABS: 20.0000 mg | ORAL_TABLET | Freq: Every day | ORAL | 1 refills | Status: DC

## 2021-10-01 ENCOUNTER — Other Ambulatory Visit: Payer: Self-pay | Admitting: Family Medicine

## 2021-10-09 ENCOUNTER — Encounter: Payer: Self-pay | Admitting: Legal Medicine

## 2021-10-09 ENCOUNTER — Ambulatory Visit (INDEPENDENT_AMBULATORY_CARE_PROVIDER_SITE_OTHER): Payer: Medicare Other | Admitting: Legal Medicine

## 2021-10-09 VITALS — BP 90/60 | HR 65 | Temp 98.7°F | Resp 16 | Ht 72.0 in | Wt 215.0 lb

## 2021-10-09 DIAGNOSIS — J029 Acute pharyngitis, unspecified: Secondary | ICD-10-CM | POA: Diagnosis not present

## 2021-10-09 DIAGNOSIS — Z9189 Other specified personal risk factors, not elsewhere classified: Secondary | ICD-10-CM

## 2021-10-09 DIAGNOSIS — J0100 Acute maxillary sinusitis, unspecified: Secondary | ICD-10-CM | POA: Diagnosis not present

## 2021-10-09 LAB — POC COVID19 BINAXNOW: SARS Coronavirus 2 Ag: NEGATIVE

## 2021-10-09 LAB — POCT RAPID STREP A (OFFICE): Rapid Strep A Screen: NEGATIVE

## 2021-10-09 MED ORDER — AMOXICILLIN 875 MG PO TABS: 875.0000 mg | ORAL_TABLET | Freq: Two times a day (BID) | ORAL | 0 refills | Status: AC

## 2021-10-09 MED ORDER — CHERATUSSIN AC 100-10 MG/5ML PO SOLN: 5.0000 mL | Freq: Three times a day (TID) | ORAL | 0 refills | Status: DC | PRN

## 2021-10-09 NOTE — Progress Notes (Signed)
Acute Office Visit  Subjective:    Patient ID: Jack Pierce, male    DOB: 23-Sep-1956, 65 y.o.   MRN: [id number]  Chief Complaint  Patient presents with   Sore Throat   Cough    HPI: Patient is in today for 3 day history of sore throat and sinus congestion with headache.  No fever now.  Cough productive.  Past Medical History:  Diagnosis Date   Essential (primary) hypertension    Gastro-esophageal reflux disease without esophagitis    Hyperlipidemia    Idiopathic progressive neuropathy    Impaired fasting glucose    Male erectile disorder    Other sequelae of other cerebrovascular disease    Paresthesias    Plantar fascial fibromatosis     Past Surgical History:  Procedure Laterality Date   [place]   1 removed    Family History  Problem Relation Age of Onset   Heart attack Mother    Coronary artery disease Father    CAD Brother    Suicidality Brother    CAD Sister     Social History   Socioeconomic History   Marital status: Married    Spouse name: Not on file   Number of children: 2   Years of education: Not on file   Highest education level: Not on file  Occupational History   Occupation: Retired  Tobacco Use   Smoking status: Never   Smokeless tobacco: Never  Vaping Use   Vaping Use: Never used  Substance and Sexual Activity   Alcohol use: No   Drug use: No   Sexual activity: Not on file  Other Topics Concern   Not on file  Social History Narrative   Not on file   Social Determinants of Health   Financial Resource Strain: Not on file  Food Insecurity: Not on file  Transportation Needs: Not on file  Physical Activity: Not on file  Stress: Not on file  Social Connections: Not on file  Intimate Partner Violence: Not on file    Outpatient Medications Prior to Visit  Medication Sig Dispense Refill   amitriptyline (ELAVIL) 25 MG tablet Take 1 tablet (25 mg total) by mouth at bedtime.  (Patient taking differently: Take 25 mg by mouth at bedtime.) 30 tablet 2   aspirin 81 MG EC tablet Take 81 mg by mouth 2 (two) times daily.     B Complex-Folic Acid (B COMPLEX-VITAMIN B12 PO) Take 1 tablet by mouth daily.     chlorthalidone (HYGROTON) 25 MG tablet Take 1 tablet (25 mg total) by mouth daily. 90 tablet 1   fluticasone (FLONASE) 50 MCG/ACT nasal spray Place 2 sprays into both nostrils daily. 16 g 6   ibuprofen (ADVIL) 600 MG tablet TAKE 1 TABLET BY MOUTH THREE TIMES DAILY WITH FOOD 90 tablet 1   lamoTRIgine (LAMICTAL) 100 MG tablet Take 1 tablet by mouth once daily 90 tablet 0   lisinopril (ZESTRIL) 20 MG tablet Take 1 tablet (20 mg total) by mouth daily. 90 tablet 1   Multiple Vitamins-Minerals (ALIVE MULTI-VITAMIN PO) Take by mouth.     neomycin-polymyxin-hydrocortisone (CORTISPORIN) OTIC solution Place 4 drops into the left ear 3 (three) times daily. 10 mL 0   pantoprazole (PROTONIX) 40 MG tablet Take 1 tablet by mouth twice daily 180 tablet 0   pravastatin (PRAVACHOL) 40 MG tablet TAKE 1 TABLET BY MOUTH AT BEDTIME 90 tablet 0   predniSONE (  DELTASONE) 20 MG tablet Take 1 tablet (20 mg total) by mouth daily with breakfast. 1 po tid for 3 days then 1 po bid for 3 days then 1 po qd for 3 days 18 tablet 0   pregabalin (LYRICA) 25 MG capsule TAKE 1 CAPSULE BY MOUTH THREE TIMES DAILY 90 capsule 3   triamcinolone cream (KENALOG) 0.1 % Apply 1 application topically 2 (two) times daily. 30 g 3   No facility-administered medications prior to visit.    Allergies  Allergen Reactions   Amlodipine Besylate Swelling   Cozaar [Losartan Potassium] Other (See Comments)    Dizziness   Cefdinir    Meloxicam Other (See Comments)    Dizziness    Review of Systems  Constitutional:  Negative for chills, fatigue, fever and unexpected weight change.  HENT:  Positive for congestion, ear pain, postnasal drip and sinus pain. Negative for sore throat.   Respiratory:  Positive for cough. Negative  for shortness of breath.   Cardiovascular:  Negative for chest pain and palpitations.  Gastrointestinal:  Negative for abdominal pain, blood in stool, constipation, diarrhea, nausea and vomiting.  Endocrine: Negative for polydipsia.  Genitourinary:  Negative for dysuria.  Musculoskeletal:  Negative for back pain.  Skin:  Negative for rash.  Neurological:  Negative for headaches.      Objective:    Physical Exam Vitals reviewed.  Constitutional:      General: He is not in acute distress.    Appearance: He is well-developed.  HENT:     Head: Normocephalic.     Right Ear: Tympanic membrane normal.     Left Ear: Tympanic membrane, ear canal and external ear normal.     Nose: Congestion present.     Mouth/Throat:     Mouth: Mucous membranes are moist.     Pharynx: Oropharynx is clear.  Eyes:     Extraocular Movements: Extraocular movements intact.     Conjunctiva/sclera: Conjunctivae normal.     Pupils: Pupils are equal, round, and reactive to light.  Cardiovascular:     Rate and Rhythm: Normal rate and regular rhythm.     Pulses: Normal pulses.     Heart sounds: Normal heart sounds. No murmur heard.   No gallop.  Pulmonary:     Effort: Pulmonary effort is normal. No respiratory distress.     Breath sounds: Normal breath sounds. No wheezing.  Abdominal:     General: Abdomen is flat. Bowel sounds are normal. There is no distension.     Tenderness: There is no abdominal tenderness.  Musculoskeletal:        General: Normal range of motion.     Cervical back: Normal range of motion.  Skin:    General: Skin is warm.     Capillary Refill: Capillary refill takes less than 2 seconds.  Neurological:     General: No focal deficit present.     Mental Status: He is alert and oriented to person, place, and time.    BP 90/60    Pulse 65    Temp 98.7 F (37.1 C)    Resp 16    Ht 6' (1.829 m)    Wt 215 lb (97.5 kg)    SpO2 98%    BMI 29.16 kg/m  Wt Readings from Last 3 Encounters:   10/09/21 215 lb (97.5 kg)  07/10/21 211 lb (95.7 kg)  06/15/21 205 lb 6.4 oz (93.2 kg)    Health Maintenance Due  Topic Date Due  Hepatitis C Screening  Never done   Pneumonia Vaccine 69+ Years old (9 - PPSV23 if available, else PCV20) 07/05/2021   TETANUS/TDAP  08/07/2021    There are no preventive care reminders to display for this patient.   Lab Results  Component Value Date   TSH 2.850 02/06/2021   Lab Results  Component Value Date   WBC 5.5 03/13/2021   HGB 12.0 (L) 03/13/2021   HCT 35.5 (L) 03/13/2021   MCV 81 03/13/2021   PLT 199 03/13/2021   Lab Results  Component Value Date   NA 136 03/13/2021   K 4.1 03/13/2021   CO2 26 03/13/2021   GLUCOSE 110 (H) 03/13/2021   BUN 15 03/13/2021   CREATININE 1.39 (H) 03/13/2021   BILITOT 0.5 03/13/2021   ALKPHOS 84 03/13/2021   AST 22 03/13/2021   ALT 15 03/13/2021   PROT 7.6 03/13/2021   ALBUMIN 4.7 03/13/2021   CALCIUM 9.6 03/13/2021   EGFR 56 (L) 03/13/2021   Lab Results  Component Value Date   CHOL 143 06/29/2020   Lab Results  Component Value Date   HDL 53 06/29/2020   Lab Results  Component Value Date   LDLCALC 76 06/29/2020   Lab Results  Component Value Date   TRIG 70 06/29/2020   Lab Results  Component Value Date   CHOLHDL 2.7 06/29/2020   Lab Results  Component Value Date   HGBA1C 6.2 (H) 02/06/2021       Assessment & Plan:   Problem List Items Addressed This Visit       Respiratory   Acute sinusitis   Relevant Medications   amoxicillin (AMOXIL) 875 MG tablet   guaiFENesin-codeine (CHERATUSSIN AC) 100-10 MG/5ML syrup Patient has acute sinusitis, treat with amoxicillin   Other Visit Diagnoses     Sore throat    -  Primary   Relevant Orders   POCT rapid strep A (Completed)- negative   At increased risk of exposure to COVID-19 virus       Relevant Orders   POC COVID-19 (Completed)- negative        Orders Placed This Encounter  Procedures   POC COVID-19   POCT rapid  strep A     Follow-up: Return if symptoms worsen or fail to improve.  An After Visit Summary was printed and given to the patient.  Reinaldo Meeker, MD [facility] [phone]

## 2021-10-25 DIAGNOSIS — D099 Carcinoma in situ, unspecified: Secondary | ICD-10-CM

## 2021-10-25 HISTORY — DX: Carcinoma in situ, unspecified: D09.9

## 2021-11-30 ENCOUNTER — Ambulatory Visit (INDEPENDENT_AMBULATORY_CARE_PROVIDER_SITE_OTHER): Payer: Medicare Other | Admitting: Legal Medicine

## 2021-11-30 ENCOUNTER — Encounter: Payer: Self-pay | Admitting: Legal Medicine

## 2021-11-30 VITALS — BP 118/60 | HR 62 | Temp 97.7°F | Resp 15 | Ht 72.0 in | Wt 213.0 lb

## 2021-11-30 DIAGNOSIS — J0100 Acute maxillary sinusitis, unspecified: Secondary | ICD-10-CM | POA: Diagnosis not present

## 2021-11-30 DIAGNOSIS — R609 Edema, unspecified: Secondary | ICD-10-CM

## 2021-11-30 MED ORDER — FUROSEMIDE 20 MG PO TABS: 20.0000 mg | ORAL_TABLET | Freq: Every day | ORAL | 0 refills | Status: DC | PRN

## 2021-11-30 MED ORDER — NOREL AD 4-10-325 MG PO TABS: 1.0000 | ORAL_TABLET | Freq: Two times a day (BID) | ORAL | 0 refills | Status: DC

## 2021-11-30 MED ORDER — AMOXICILLIN 875 MG PO TABS: 875.0000 mg | ORAL_TABLET | Freq: Two times a day (BID) | ORAL | 0 refills | Status: AC

## 2021-11-30 NOTE — Progress Notes (Signed)
Acute Office Visit  Subjective:    Patient ID: Jack Pierce, male    DOB: 1956-01-21, 66 y.o.   MRN: [id number]  Chief Complaint  Patient presents with   Sinusitis    HPI: Patient is in today for sinus pressure, bilateral ear fullness and some pain and teeth hurts, post nasal drainage, sinus headache, cough. He mentioned that he did covid 19 test  at home and it was negative. No fever or chills.   Past Medical History:  Diagnosis Date   Essential (primary) hypertension    Gastro-esophageal reflux disease without esophagitis    Hyperlipidemia    Idiopathic progressive neuropathy    Impaired fasting glucose    Male erectile disorder    Other sequelae of other cerebrovascular disease    Paresthesias    Plantar fascial fibromatosis     Past Surgical History:  Procedure Laterality Date   [place]   1 removed    Family History  Problem Relation Age of Onset   Heart attack Mother    Coronary artery disease Father    CAD Brother    Suicidality Brother    CAD Sister     Social History   Socioeconomic History   Marital status: Married    Spouse name: Not on file   Number of children: 2   Years of education: Not on file   Highest education level: Not on file  Occupational History   Occupation: Retired  Tobacco Use   Smoking status: Never   Smokeless tobacco: Never  Vaping Use   Vaping Use: Never used  Substance and Sexual Activity   Alcohol use: No   Drug use: No   Sexual activity: Not on file  Other Topics Concern   Not on file  Social History Narrative   Not on file   Social Determinants of Health   Financial Resource Strain: Not on file  Food Insecurity: Not on file  Transportation Needs: Not on file  Physical Activity: Not on file  Stress: Not on file  Social Connections: Not on file  Intimate Partner Violence: Not on file    Outpatient Medications Prior to Visit  Medication Sig Dispense Refill    aspirin 81 MG EC tablet Take 81 mg by mouth 2 (two) times daily.     B Complex-Folic Acid (B COMPLEX-VITAMIN B12 PO) Take 1 tablet by mouth daily.     chlorthalidone (HYGROTON) 25 MG tablet Take 1 tablet (25 mg total) by mouth daily. 90 tablet 1   fluorouracil (EFUDEX) 5 % cream SMARTSIG:Sparingly Topical Twice Daily     fluticasone (FLONASE) 50 MCG/ACT nasal spray Place 2 sprays into both nostrils daily. 16 g 6   ibuprofen (ADVIL) 600 MG tablet TAKE 1 TABLET BY MOUTH THREE TIMES DAILY WITH FOOD 90 tablet 1   lamoTRIgine (LAMICTAL) 100 MG tablet Take 1 tablet by mouth once daily 90 tablet 0   lisinopril (ZESTRIL) 20 MG tablet Take 1 tablet (20 mg total) by mouth daily. 90 tablet 1   Multiple Vitamins-Minerals (ALIVE MULTI-VITAMIN PO) Take by mouth.     nitroGLYCERIN (NITROSTAT) 0.4 MG SL tablet SMARTSIG:1 Tablet(s) Sublingual PRN     pantoprazole (PROTONIX) 40 MG tablet Take 1 tablet by mouth twice daily 180 tablet 0   pravastatin (PRAVACHOL) 40 MG tablet TAKE 1 TABLET BY MOUTH AT BEDTIME 90 tablet 0   pregabalin (LYRICA) 25 MG capsule TAKE 1 CAPSULE BY  MOUTH THREE TIMES DAILY 90 capsule 3   amitriptyline (ELAVIL) 25 MG tablet Take 1 tablet (25 mg total) by mouth at bedtime. (Patient taking differently: Take 25 mg by mouth at bedtime.) 30 tablet 2   guaiFENesin-codeine (CHERATUSSIN AC) 100-10 MG/5ML syrup Take 5 mLs by mouth 3 (three) times daily as needed. 120 mL 0   neomycin-polymyxin-hydrocortisone (CORTISPORIN) OTIC solution Place 4 drops into the left ear 3 (three) times daily. 10 mL 0   predniSONE (DELTASONE) 20 MG tablet Take 1 tablet (20 mg total) by mouth daily with breakfast. 1 po tid for 3 days then 1 po bid for 3 days then 1 po qd for 3 days 18 tablet 0   triamcinolone cream (KENALOG) 0.1 % Apply 1 application topically 2 (two) times daily. 30 g 3   No facility-administered medications prior to visit.    Allergies  Allergen Reactions   Amlodipine Besylate Swelling   Cozaar  [Losartan Potassium] Other (See Comments)    Dizziness   Cefdinir    Diphenhydramine Hcl     Other reaction(s): Other (see comments) Patient feels loopy   Meloxicam Other (See Comments)    Dizziness    Review of Systems  Constitutional:  Negative for chills, fatigue, fever and unexpected weight change.  HENT:  Positive for congestion, postnasal drip and sinus pain. Negative for ear pain and sore throat.   Respiratory:  Positive for cough.   Cardiovascular:  Negative for chest pain and palpitations.  Gastrointestinal:  Negative for abdominal pain, blood in stool, constipation, diarrhea, nausea and vomiting.  Endocrine: Negative for polydipsia.  Genitourinary:  Negative for dysuria.  Musculoskeletal:  Negative for back pain.  Skin:  Negative for rash.  Neurological:  Positive for headaches.      Objective:    Physical Exam Vitals reviewed.  Constitutional:      General: He is not in acute distress.    Appearance: Normal appearance.  HENT:     Ears:     Comments: Both ears impacted with cerumen, cleansed    Nose: Congestion present.     Mouth/Throat:     Mouth: Mucous membranes are moist.     Pharynx: Oropharynx is clear.  Eyes:     Extraocular Movements: Extraocular movements intact.     Conjunctiva/sclera: Conjunctivae normal.     Pupils: Pupils are equal, round, and reactive to light.  Cardiovascular:     Pulses: Normal pulses.     Heart sounds: Normal heart sounds. No murmur heard.   No gallop.  Pulmonary:     Effort: Pulmonary effort is normal. No respiratory distress.     Breath sounds: Normal breath sounds. No wheezing.  Abdominal:     General: Abdomen is flat. Bowel sounds are normal. There is no distension.     Tenderness: There is no abdominal tenderness.  Musculoskeletal:        General: Normal range of motion.     Right lower leg: No edema.     Left lower leg: No edema.  Skin:    General: Skin is warm.     Capillary Refill: Capillary refill takes less  than 2 seconds.  Neurological:     General: No focal deficit present.     Mental Status: He is alert and oriented to person, place, and time.    BP 118/60    Pulse 62    Temp 97.7 F (36.5 C)    Resp 15    Ht 6' (1.829 m)  Wt 213 lb (96.6 kg)    SpO2 97%    BMI 28.89 kg/m  Wt Readings from Last 3 Encounters:  11/30/21 213 lb (96.6 kg)  10/09/21 215 lb (97.5 kg)  07/10/21 211 lb (95.7 kg)    Health Maintenance Due  Topic Date Due   Hepatitis C Screening  Never done   Pneumonia Vaccine 28+ Years old (21) 07/05/2021   TETANUS/TDAP  08/07/2021    There are no preventive care reminders to display for this patient.   Lab Results  Component Value Date   TSH 2.850 02/06/2021   Lab Results  Component Value Date   WBC 5.5 03/13/2021   HGB 12.0 (L) 03/13/2021   HCT 35.5 (L) 03/13/2021   MCV 81 03/13/2021   PLT 199 03/13/2021   Lab Results  Component Value Date   NA 136 03/13/2021   K 4.1 03/13/2021   CO2 26 03/13/2021   GLUCOSE 110 (H) 03/13/2021   BUN 15 03/13/2021   CREATININE 1.39 (H) 03/13/2021   BILITOT 0.5 03/13/2021   ALKPHOS 84 03/13/2021   AST 22 03/13/2021   ALT 15 03/13/2021   PROT 7.6 03/13/2021   ALBUMIN 4.7 03/13/2021   CALCIUM 9.6 03/13/2021   EGFR 56 (L) 03/13/2021   Lab Results  Component Value Date   CHOL 143 06/29/2020   Lab Results  Component Value Date   HDL 53 06/29/2020   Lab Results  Component Value Date   LDLCALC 76 06/29/2020   Lab Results  Component Value Date   TRIG 70 06/29/2020   Lab Results  Component Value Date   CHOLHDL 2.7 06/29/2020   Lab Results  Component Value Date   HGBA1C 6.2 (H) 02/06/2021       Assessment & Plan:   Diagnoses and all orders for this visit: Acute non-recurrent maxillary sinusitis -     amoxicillin (AMOXIL) 875 MG tablet; Take 1 tablet (875 mg total) by mouth 2 (two) times daily for 10 days. Patient has sinusitis, treat with augmentin  Edema, unspecified type -     furosemide (LASIX)  20 MG tablet; Take 1 tablet (20 mg total) by mouth daily as needed. He has some pedal edema, use furosemide PRN Other orders -     Chlorphen-PE-Acetaminophen (NOREL AD) 4-10-325 MG TABS; Take 1 tablet by mouth 2 (two) times daily.        Follow-up: Return if symptoms worsen or fail to improve.  An After Visit Summary was printed and given to the patient.  Reinaldo Meeker, MD [facility] [phone]

## 2021-12-04 ENCOUNTER — Other Ambulatory Visit: Payer: Self-pay | Admitting: Family Medicine

## 2021-12-04 NOTE — Telephone Encounter (Signed)
Refill sent to pharmacy.   

## 2021-12-09 ENCOUNTER — Other Ambulatory Visit: Payer: Self-pay | Admitting: Family Medicine

## 2021-12-18 ENCOUNTER — Other Ambulatory Visit: Payer: Self-pay

## 2021-12-18 ENCOUNTER — Telehealth: Payer: Self-pay

## 2021-12-18 MED ORDER — DOXYCYCLINE HYCLATE 100 MG PO TABS: 100.0000 mg | ORAL_TABLET | Freq: Two times a day (BID) | ORAL | 0 refills | Status: DC

## 2021-12-18 NOTE — Telephone Encounter (Signed)
I sent prescription of Doxycycline per Dr Henrene Pastor to pharmacy. Patient was informed.

## 2021-12-18 NOTE — Telephone Encounter (Signed)
General/Other - productive cough  Patient recently on amoxicillin for sinusitis. He has completed this medication. Currently still having productive cough. Symptoms did decrease while on medication. Has been taking sinus PE and Allergy once a day w/o relief.   Callback#: [id number]  Jack Pierce 12/18/21 9:40 AM

## 2021-12-18 NOTE — Telephone Encounter (Signed)
Cal in doxycycline 100mg  bid for 10 days lp

## 2021-12-21 ENCOUNTER — Other Ambulatory Visit: Payer: Self-pay | Admitting: Legal Medicine

## 2021-12-21 DIAGNOSIS — J0100 Acute maxillary sinusitis, unspecified: Secondary | ICD-10-CM

## 2021-12-21 MED ORDER — CLARITHROMYCIN 500 MG PO TABS: 500.0000 mg | ORAL_TABLET | Freq: Two times a day (BID) | ORAL | 0 refills | Status: DC

## 2022-01-10 ENCOUNTER — Other Ambulatory Visit: Payer: Self-pay

## 2022-01-10 ENCOUNTER — Encounter: Payer: Self-pay | Admitting: Legal Medicine

## 2022-01-10 ENCOUNTER — Ambulatory Visit (INDEPENDENT_AMBULATORY_CARE_PROVIDER_SITE_OTHER): Payer: Medicare Other | Admitting: Legal Medicine

## 2022-01-10 ENCOUNTER — Ambulatory Visit (INDEPENDENT_AMBULATORY_CARE_PROVIDER_SITE_OTHER): Payer: Medicare Other | Admitting: Sports Medicine

## 2022-01-10 ENCOUNTER — Encounter: Payer: Self-pay | Admitting: Sports Medicine

## 2022-01-10 VITALS — BP 102/80 | HR 72 | Temp 98.3°F | Resp 15 | Ht 72.0 in | Wt 212.0 lb

## 2022-01-10 DIAGNOSIS — M79672 Pain in left foot: Secondary | ICD-10-CM

## 2022-01-10 DIAGNOSIS — K141 Geographic tongue: Secondary | ICD-10-CM

## 2022-01-10 DIAGNOSIS — J309 Allergic rhinitis, unspecified: Secondary | ICD-10-CM

## 2022-01-10 DIAGNOSIS — M79671 Pain in right foot: Secondary | ICD-10-CM

## 2022-01-10 DIAGNOSIS — M722 Plantar fascial fibromatosis: Secondary | ICD-10-CM

## 2022-01-10 DIAGNOSIS — G603 Idiopathic progressive neuropathy: Secondary | ICD-10-CM

## 2022-01-10 MED ORDER — PREDNISONE 10 MG (21) PO TBPK: ORAL_TABLET | ORAL | 0 refills | Status: DC

## 2022-01-10 MED ORDER — IBUPROFEN 600 MG PO TABS: 600.0000 mg | ORAL_TABLET | Freq: Three times a day (TID) | ORAL | 3 refills | Status: DC

## 2022-01-10 MED ORDER — FLUTICASONE PROPIONATE 50 MCG/ACT NA SUSP: 2.0000 | Freq: Every day | NASAL | 6 refills | Status: DC

## 2022-01-10 MED ORDER — TRIAMCINOLONE ACETONIDE 10 MG/ML IJ SUSP
10.0000 mg | Freq: Once | INTRAMUSCULAR | Status: AC
  Administered 2022-01-10: 10 mg

## 2022-01-10 MED ORDER — NYSTATIN 100000 UNIT/ML MT SUSP: 5.0000 mL | Freq: Three times a day (TID) | OROMUCOSAL | 1 refills | Status: DC

## 2022-01-10 NOTE — Progress Notes (Signed)
Subjective: [name] is a 66 y.o. male patient who returns to office for follow up foot pain. Reports pain has flared back up and has been hurting for about a week now right greater than left heel and states that he has been doing the stretches and they have not helped.  Patient also still reports that he has numbness tingling burning sensations the nervive cream helps has not tried the over-the-counter vitamin. ? ? ?Patient Active Problem List  ? Diagnosis Date Noted  ? Chest pain 09/19/2021  ? Gastro-esophageal reflux disease without esophagitis 08/07/2021  ? Male erectile disorder 08/07/2021  ? Serous otitis media 06/15/2021  ? Contact dermatitis 06/15/2021  ? Lab test positive for detection of COVID-19 virus 07/21/2020  ? Pharyngitis 04/22/2020  ? Other sequelae of other cerebrovascular disease   ? Acute sinusitis 12/28/2019  ? Essential hypertension, benign 12/22/2019  ? Idiopathic progressive neuropathy 12/22/2019  ? Impaired fasting glucose 12/22/2019  ? Paresthesias 04/06/2019  ? 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 Neuropathy involving both lower extremities 10/30/2018  ? 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 Plantar fasciitis 01/07/2018  ? Mixed hyperlipidemia 11/11/2012  ? ? ?Current Outpatient Medications on File Prior to Visit  ?Medication Sig Dispense Refill  ? aspirin 81 MG EC tablet Take 81 mg by mouth 2 (two) times daily.    ? B Complex-Folic Acid (B COMPLEX-VITAMIN B12 PO) Take 1 tablet by mouth daily.    ? Chlorphen-PE-Acetaminophen (NOREL AD) 4-10-325 MG TABS Take 1 tablet by mouth 2 (two) times daily. 30 tablet 0  ? chlorthalidone (HYGROTON) 25 MG tablet Take 1 tablet by mouth once daily 90 tablet 0  ? clarithromycin (BIAXIN) 500 MG tablet Take 1 tablet (500 mg total) by mouth 2 (two) times daily. 20 tablet 0  ? fluorouracil (EFUDEX) 5 % cream SMARTSIG:Sparingly Topical Twice Daily    ? fluticasone (FLONASE) 50 MCG/ACT nasal spray Place 2 sprays into both nostrils daily. 16 g 6  ?  furosemide (LASIX) 20 MG tablet Take 1 tablet (20 mg total) by mouth daily as needed. 30 tablet 0  ? ibuprofen (ADVIL) 600 MG tablet TAKE 1 TABLET BY MOUTH THREE TIMES DAILY WITH FOOD 90 tablet 1  ? lamoTRIgine (LAMICTAL) 100 MG tablet Take 1 tablet by mouth once daily 90 tablet 0  ? lisinopril (ZESTRIL) 20 MG tablet Take 1 tablet (20 mg total) by mouth daily. 90 tablet 1  ? Multiple Vitamins-Minerals (ALIVE MULTI-VITAMIN PO) Take by mouth.    ? nitroGLYCERIN (NITROSTAT) 0.4 MG SL tablet SMARTSIG:1 Tablet(s) Sublingual PRN    ? pantoprazole (PROTONIX) 40 MG tablet Take 1 tablet by mouth twice daily 180 tablet 0  ? pravastatin (PRAVACHOL) 40 MG tablet TAKE ONE TABLET BY MOUTH AT BED TIME 90 tablet 0  ? pregabalin (LYRICA) 25 MG capsule TAKE 1 CAPSULE BY MOUTH THREE TIMES DAILY 90 capsule 3  ? ?No current facility-administered medications on file prior to visit.  ? ? ?Allergies  ?Allergen Reactions  ? Amlodipine Besylate Swelling  ? Cozaar [Losartan Potassium] Other (See Comments)  ?  Dizziness  ? Cefdinir   ? Diphenhydramine Hcl   ?  Other reaction(s): Other (see comments) ?Patient feels loopy  ? Meloxicam Other (See Comments)  ?  Dizziness  ? Doxycycline Anxiety  ? ? ?Objective:  ?General: Alert and oriented x3 in no acute distress ? ?Dermatology: No open lesions bilateral lower extremities, no webspace macerations, no ecchymosis bilateral, all nails x 10 are well manicured. ? ?  Vascular: Dorsalis Pedis and Posterior Tibial pedal pulses palpable, faintly 1 out of 4 bilateral, capillary Fill Time 5 seconds,(-) pedal hair growth bilateral, trace edema bilateral lower extremities, varicosities bilateral, temperature gradient within normal limits. ? ?Neurology: Gross sensation intact via light touch bilateral, subjective sharp shooting tingling pain likely nerve related bilateral. ? ?Musculoskeletal: Mild to moderate tenderness with palpation at plantar fascial insertion at the heel arch bilateral.  There is limited  ankle joint range of motion bilateral.  There is cavus deformity with bunion noted at right greater than left.  There is decrease in strength bilateral 4 out of 5. ? ?Assessment and Plan: ?Problem List Items Addressed This Visit   ? ?  ? Nervous and Auditory  ? Idiopathic progressive neuropathy  ? ?Other Visit Diagnoses   ? ? Plantar fasciitis, bilateral    -  Primary  ? Bilateral foot pain      ? ?  ? ? ? ? ?-Complete examination performed ?-Re-Discussed treatement options for likely neuropathy with bilateral foot pain of Plantar fasciitis ?-After oral consent and aseptic prep, injected a mixture containing 1 ml of 2%  ?plain lidocaine, 1 ml 0.5% plain marcaine, 0.5 ml of kenalog 10 and 0.5 ml of dexamethasone phosphate into right and left heels at plantar fascial insertion without complication. Post-injection care discussed with patient.   ?-Advised gentle stretching  and icing daily as directed on a consistent basis ?-Continue over-the-counter supplement of Nervive for nerve issues as well as over-the-counter topical pain cream and rub as directed; advised patient to get Nervive since he does not recall picking it up yet again this visit ?-Advised patient to be consistent with daily stretching and icing as directed ?-Advised patient if pain continues he would benefit from physical therapy ?-Patient to return if symptoms fail to improve.  Advised patient may call office for prescription for physical therapy or come back in sooner if worsens. ? ?Asencion Islam, DPM ? ? ?

## 2022-01-10 NOTE — Patient Instructions (Signed)
Nervive nerve relief supplement for your nerves can be purchased OTC at [facility]  

## 2022-01-10 NOTE — Progress Notes (Signed)
? ?Subjective:  ?Patient ID: Jack CoronaBruce W Pierce, male    DOB: 05/24/1956  Age: 66 y.o. MRN: [id number] ? ?Chief Complaint  ?Patient presents with  ? Sore Throat  ? ? ?HPI ?Patient mentioned that he has sore throat, hoarseness, cough, and sinus pressure since one week ago. He denied fever, chills, chest pain or sob. ? ? ?Current Outpatient Medications on File Prior to Visit  ?Medication Sig Dispense Refill  ? aspirin 81 MG EC tablet Take 81 mg by mouth 2 (two) times daily.    ? B Complex-Folic Acid (B COMPLEX-VITAMIN B12 PO) Take 1 tablet by mouth daily.    ? Chlorphen-PE-Acetaminophen (NOREL AD) 4-10-325 MG TABS Take 1 tablet by mouth 2 (two) times daily. 30 tablet 0  ? chlorthalidone (HYGROTON) 25 MG tablet Take 1 tablet by mouth once daily 90 tablet 0  ? fluorouracil (EFUDEX) 5 % cream SMARTSIG:Sparingly Topical Twice Daily    ? furosemide (LASIX) 20 MG tablet Take 1 tablet (20 mg total) by mouth daily as needed. 30 tablet 0  ? lamoTRIgine (LAMICTAL) 100 MG tablet Take 1 tablet by mouth once daily 90 tablet 0  ? lisinopril (ZESTRIL) 20 MG tablet Take 1 tablet (20 mg total) by mouth daily. 90 tablet 1  ? Multiple Vitamins-Minerals (ALIVE MULTI-VITAMIN PO) Take by mouth.    ? nitroGLYCERIN (NITROSTAT) 0.4 MG SL tablet SMARTSIG:1 Tablet(s) Sublingual PRN    ? pantoprazole (PROTONIX) 40 MG tablet Take 1 tablet by mouth twice daily 180 tablet 0  ? pravastatin (PRAVACHOL) 40 MG tablet TAKE ONE TABLET BY MOUTH AT BED TIME 90 tablet 0  ? pregabalin (LYRICA) 25 MG capsule TAKE 1 CAPSULE BY MOUTH THREE TIMES DAILY 90 capsule 3  ? ?No current facility-administered medications on file prior to visit.  ? ?Past Medical History:  ?Diagnosis Date  ? Essential (primary) hypertension   ? Gastro-esophageal reflux disease without esophagitis   ? Hyperlipidemia   ? Idiopathic progressive neuropathy   ? Impaired fasting glucose   ? Male erectile disorder   ? Other sequelae of other cerebrovascular disease   ? Paresthesias   ? Plantar  fascial fibromatosis   ? ?Past Surgical History:  ?Procedure Laterality Date  ? HERNIA REPAIR  1964 & 1967  ? TESTICLE REMOVAL  1981  ? 1 removed  ?  ?Family History  ?Problem Relation Age of Onset  ? Heart attack Mother   ? Coronary artery disease Father   ? CAD Brother   ? Suicidality Brother   ? CAD Sister   ? ?Social History  ? ?Socioeconomic History  ? Marital status: Married  ?  Spouse name: Not on file  ? Number of children: 2  ? Years of education: Not on file  ? Highest education level: Not on file  ?Occupational History  ? Occupation: Retired  ?Tobacco Use  ? Smoking status: Never  ? Smokeless tobacco: Never  ?Vaping Use  ? Vaping Use: Never used  ?Substance and Sexual Activity  ? Alcohol use: No  ? Drug use: No  ? Sexual activity: Not on file  ?Other Topics Concern  ? Not on file  ?Social History Narrative  ? Not on file  ? ?Social Determinants of Health  ? ?Financial Resource Strain: Not on file  ?Food Insecurity: Not on file  ?Transportation Needs: Not on file  ?Physical Activity: Not on file  ?Stress: Not on file  ?Social Connections: Not on file  ? ? ?Review of Systems  ?Constitutional:  Negative for chills, fatigue, fever and unexpected weight change.  ?HENT:  Positive for congestion, sinus pressure and sore throat. Negative for ear pain and sinus pain.   ?Respiratory:  Positive for cough.   ?Cardiovascular:  Negative for chest pain and palpitations.  ?Gastrointestinal:  Negative for abdominal pain, blood in stool, constipation, diarrhea, nausea and vomiting.  ?Endocrine: Negative for polydipsia.  ?Genitourinary:  Negative for dysuria.  ?Musculoskeletal:  Negative for back pain.  ?Skin:  Negative for rash.  ?Neurological:  Negative for headaches.  ? ? ?Objective:  ?BP 102/80   Pulse 72   Temp 98.3 ?F (36.8 ?C)   Resp 15   Ht 6' (1.829 m)   Wt 212 lb (96.2 kg)   SpO2 97%   BMI 28.75 kg/m?  ? ? ?  01/10/2022  ?  1:35 PM 11/30/2021  ? 11:46 AM 10/09/2021  ?  2:33 PM  ?BP/Weight  ?Systolic BP 102 118  90  ?Diastolic BP 80 60 60  ?Wt. (Lbs) 212 213 215  ?BMI 28.75 kg/m2 28.89 kg/m2 29.16 kg/m2  ? ? ?Physical Exam ?Vitals reviewed.  ?Constitutional:   ?   General: He is not in acute distress. ?   Appearance: He is well-developed.  ?HENT:  ?   Head: Normocephalic.  ?   Right Ear: Tympanic membrane normal.  ?   Left Ear: Tympanic membrane normal.  ?   Nose: Nose normal.  ?   Mouth/Throat:  ?   Mouth: Mucous membranes are moist.  ?   Pharynx: Oropharynx is clear.  ?   Comments: Geographic tongue ?Eyes:  ?   Extraocular Movements: Extraocular movements intact.  ?   Conjunctiva/sclera: Conjunctivae normal.  ?   Pupils: Pupils are equal, round, and reactive to light.  ?Cardiovascular:  ?   Heart sounds: No murmur heard. ?  No gallop.  ?Pulmonary:  ?   Effort: Pulmonary effort is normal. No respiratory distress.  ?   Breath sounds: Wheezing present.  ?Abdominal:  ?   General: Abdomen is flat. Bowel sounds are normal. There is no distension.  ?   Tenderness: There is no abdominal tenderness.  ?Musculoskeletal:  ?   Right lower leg: No edema.  ?   Left lower leg: No edema.  ?Skin: ?   General: Skin is warm.  ?   Capillary Refill: Capillary refill takes less than 2 seconds.  ?Neurological:  ?   General: No focal deficit present.  ?   Mental Status: He is alert and oriented to person, place, and time. Mental status is at baseline.  ?   Gait: Gait normal.  ? ? ? ?  ? ?Lab Results  ?Component Value Date  ? WBC 5.5 03/13/2021  ? HGB 12.0 (L) 03/13/2021  ? HCT 35.5 (L) 03/13/2021  ? PLT 199 03/13/2021  ? GLUCOSE 110 (H) 03/13/2021  ? CHOL 143 06/29/2020  ? TRIG 70 06/29/2020  ? HDL 53 06/29/2020  ? LDLCALC 76 06/29/2020  ? ALT 15 03/13/2021  ? AST 22 03/13/2021  ? NA 136 03/13/2021  ? K 4.1 03/13/2021  ? CL 95 (L) 03/13/2021  ? CREATININE 1.39 (H) 03/13/2021  ? BUN 15 03/13/2021  ? CO2 26 03/13/2021  ? TSH 2.850 02/06/2021  ? HGBA1C 6.2 (H) 02/06/2021  ? ? ? ? ?Assessment & Plan:  ? ?Problem List Items Addressed This Visit   ? ?   ? Musculoskeletal and Integument  ? Plantar fasciitis  ? Relevant Medications  ?  ibuprofen (ADVIL) 600 MG tablet ?Patient has chronic plantar fasciitis  ? ?Other Visit Diagnoses   ? ? Geographic tongue    -  Primary  ? Relevant Medications  ? magic mouthwash (nystatin, hydrocortisone, diphenhydrAMINE) suspension ?Patient has geographic tongue  ? Allergic sinusitis      ? Relevant Medications  ? fluticasone (FLONASE) 50 MCG/ACT nasal spray  ? magic mouthwash (nystatin, hydrocortisone, diphenhydrAMINE) suspension  ? predniSONE (STERAPRED UNI-PAK 21 TAB) 10 MG (21) TBPK tablet ?Patient has allergic rhinitis  ? ?  ?. ? ?Meds ordered this encounter  ?Medications  ? fluticasone (FLONASE) 50 MCG/ACT nasal spray  ?  Sig: Place 2 sprays into both nostrils daily.  ?  Dispense:  16 g  ?  Refill:  6  ? magic mouthwash (nystatin, hydrocortisone, diphenhydrAMINE) suspension  ?  Sig: Swish and swallow 5 mLs 3 (three) times daily.  ?  Dispense:  540 mL  ?  Refill:  1  ? predniSONE (STERAPRED UNI-PAK 21 TAB) 10 MG (21) TBPK tablet  ?  Sig: Take 6ills first day , then 5 pills day 2 and then cut down one pill day until gone  ?  Dispense:  21 tablet  ?  Refill:  0  ? ibuprofen (ADVIL) 600 MG tablet  ?  Sig: Take 1 tablet (600 mg total) by mouth 3 (three) times daily with meals.  ?  Dispense:  90 tablet  ?  Refill:  3  ? ? ?  ? ?Follow-up: Return if symptoms worsen or fail to improve. ? ?An After Visit Summary was printed and given to the patient. ? ?Brent Bulla, MD ?[facility] ?[phone] ?

## 2022-03-05 ENCOUNTER — Other Ambulatory Visit: Payer: Self-pay | Admitting: Family Medicine

## 2022-03-07 ENCOUNTER — Other Ambulatory Visit: Payer: Self-pay | Admitting: Family Medicine

## 2022-03-09 ENCOUNTER — Other Ambulatory Visit: Payer: Self-pay | Admitting: Family Medicine

## 2022-03-20 ENCOUNTER — Ambulatory Visit (INDEPENDENT_AMBULATORY_CARE_PROVIDER_SITE_OTHER): Payer: Medicare Other | Admitting: Sports Medicine

## 2022-03-20 ENCOUNTER — Encounter: Payer: Self-pay | Admitting: Sports Medicine

## 2022-03-20 DIAGNOSIS — M79671 Pain in right foot: Secondary | ICD-10-CM | POA: Diagnosis not present

## 2022-03-20 DIAGNOSIS — M79672 Pain in left foot: Secondary | ICD-10-CM | POA: Diagnosis not present

## 2022-03-20 DIAGNOSIS — M722 Plantar fascial fibromatosis: Secondary | ICD-10-CM | POA: Diagnosis not present

## 2022-03-20 DIAGNOSIS — G603 Idiopathic progressive neuropathy: Secondary | ICD-10-CM | POA: Diagnosis not present

## 2022-03-20 NOTE — Progress Notes (Signed)
Subjective: Jack Pierce is a 66 y.o. male patient who returns to office for follow up foot pain. Reports pain is better 5/10. States that he has a few questions.No other issues noted.    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     B Complex-Folic Acid (B COMPLEX-VITAMIN B12 PO) Take 1 tablet by mouth daily.     Chlorphen-PE-Acetaminophen (NOREL AD) 4-10-325 MG TABS Take 1 tablet by mouth 2 (two) times daily. 30 tablet 0   chlorthalidone (HYGROTON) 25 MG tablet Take 1 tablet by mouth once daily 90 tablet 0   fluorouracil (EFUDEX) 5 % cream SMARTSIG:Sparingly Topical Twice Daily     fluticasone (FLONASE) 50 MCG/ACT nasal spray Place 2 sprays into both nostrils daily. 16 g 6   furosemide (LASIX) 20 MG tablet Take 1 tablet (20 mg total) by mouth daily as needed. 30 tablet 0   ibuprofen (ADVIL) 600 MG tablet Take 1 tablet (600 mg total) by mouth 3 (three) times daily with meals. 90 tablet 3   lamoTRIgine (LAMICTAL) 100 MG tablet Take 1 tablet by mouth once daily 90 tablet 0   lisinopril (ZESTRIL) 20 MG tablet Take 1  tablet by mouth once daily 90 tablet 0   magic mouthwash (nystatin, hydrocortisone, diphenhydrAMINE) suspension Swish and swallow 5 mLs 3 (three) times daily. 540 mL 1   Multiple Vitamins-Minerals (ALIVE MULTI-VITAMIN PO) Take by mouth.     nitroGLYCERIN (NITROSTAT) 0.4 MG SL tablet SMARTSIG:1 Tablet(s) Sublingual PRN     pantoprazole (PROTONIX) 40 MG tablet Take 1 tablet by mouth twice daily 180 tablet 0   pravastatin (PRAVACHOL) 40 MG tablet TAKE ONE TABLET BY MOUTH AT BED TIME 90 tablet 0   predniSONE (STERAPRED UNI-PAK 21 TAB) 10 MG (21) TBPK tablet Take 6ills first day , then 5 pills day 2 and then cut down one pill day until gone 21 tablet 0   pregabalin (LYRICA) 25 MG capsule TAKE 1 CAPSULE BY MOUTH THREE TIMES DAILY 90 capsule 3   tamsulosin (FLOMAX) 0.4 MG CAPS capsule Take 0.4 mg by mouth daily.     No current facility-administered medications on file prior to visit.    Allergies  Allergen Reactions   Amlodipine Besylate Swelling   Cozaar [Losartan Potassium] Other (See Comments)    Dizziness   Cefdinir    Diphenhydramine Hcl     Other reaction(s): Other (see comments) Patient feels loopy   Meloxicam Other (See Comments)    Dizziness   Doxycycline Anxiety    Objective:  General: Alert and oriented x3 in no acute distress  Dermatology: No open lesions bilateral lower extremities, no webspace macerations,  no ecchymosis bilateral, all nails x 10 are well manicured.  Vascular: Dorsalis Pedis and Posterior Tibial pedal pulses palpable, faintly 1 out of 4 bilateral, capillary Fill Time 5 seconds,(-) pedal hair growth bilateral, trace edema bilateral lower extremities, varicosities bilateral, temperature gradient within normal limits.  Neurology: Johney Maine sensation intact via light touch bilateral, subjective sharp shooting tingling pain likely nerve related bilateral.  Musculoskeletal: Minimal tenderness with palpation at plantar fascial insertion at the heel arch bilateral.   Mild pain to left and right ankles, There is limited ankle joint range of motion bilateral.  There is cavus deformity with bunion noted at right greater than left.  There is decrease in strength bilateral 4 out of 5.  Assessment and Plan: Problem List Items Addressed This Visit       Nervous and Auditory   Idiopathic progressive neuropathy   Other Visit Diagnoses     Plantar fasciitis, bilateral    -  Primary   Bilateral foot pain             -Complete examination performed -Re-Discussed treatement options for likely neuropathy with bilateral foot pain of Plantar fasciitis -Advised patient that he should wait for re-injection when pain is significant   -Advised gentle stretching  and icing daily as directed on a consistent basis; Rx for PT at [facility] -Continue over-the-counter supplement of Nervive for nerve issues as well as over-the-counter topical pain cream and rub as directed previously  -Return PRN  Landis Martins, DPM

## 2022-04-02 ENCOUNTER — Other Ambulatory Visit: Payer: Self-pay | Admitting: Family Medicine

## 2022-04-23 ENCOUNTER — Other Ambulatory Visit: Payer: Self-pay | Admitting: Family Medicine

## 2022-04-23 DIAGNOSIS — G603 Idiopathic progressive neuropathy: Secondary | ICD-10-CM

## 2022-04-30 ENCOUNTER — Other Ambulatory Visit: Payer: Self-pay | Admitting: Family Medicine

## 2022-05-14 ENCOUNTER — Telehealth: Payer: Self-pay | Admitting: Podiatry

## 2022-05-14 NOTE — Telephone Encounter (Signed)
Pt needs an updated order for [facility] to continue treatment.

## 2022-05-15 ENCOUNTER — Ambulatory Visit: Payer: Medicare Other | Admitting: Legal Medicine

## 2022-05-21 ENCOUNTER — Other Ambulatory Visit: Payer: Self-pay | Admitting: Family Medicine

## 2022-05-31 ENCOUNTER — Ambulatory Visit (INDEPENDENT_AMBULATORY_CARE_PROVIDER_SITE_OTHER): Payer: Medicare Other | Admitting: Family Medicine

## 2022-05-31 ENCOUNTER — Encounter: Payer: Self-pay | Admitting: Family Medicine

## 2022-05-31 VITALS — BP 130/72 | HR 58 | Temp 97.8°F | Ht 72.0 in | Wt 215.0 lb

## 2022-05-31 DIAGNOSIS — G603 Idiopathic progressive neuropathy: Secondary | ICD-10-CM | POA: Diagnosis not present

## 2022-05-31 DIAGNOSIS — R7301 Impaired fasting glucose: Secondary | ICD-10-CM

## 2022-05-31 DIAGNOSIS — J0100 Acute maxillary sinusitis, unspecified: Secondary | ICD-10-CM | POA: Diagnosis not present

## 2022-05-31 DIAGNOSIS — R300 Dysuria: Secondary | ICD-10-CM

## 2022-05-31 DIAGNOSIS — I10 Essential (primary) hypertension: Secondary | ICD-10-CM

## 2022-05-31 DIAGNOSIS — K219 Gastro-esophageal reflux disease without esophagitis: Secondary | ICD-10-CM

## 2022-05-31 DIAGNOSIS — E782 Mixed hyperlipidemia: Secondary | ICD-10-CM | POA: Diagnosis not present

## 2022-05-31 LAB — POCT URINALYSIS DIP (CLINITEK)
Bilirubin, UA: NEGATIVE
Blood, UA: NEGATIVE
Glucose, UA: NEGATIVE mg/dL
Ketones, POC UA: NEGATIVE mg/dL
Leukocytes, UA: NEGATIVE
Nitrite, UA: NEGATIVE
POC PROTEIN,UA: NEGATIVE
Spec Grav, UA: 1.01 (ref 1.010–1.025)
Urobilinogen, UA: NEGATIVE E.U./dL — AB
pH, UA: 7.5 (ref 5.0–8.0)

## 2022-05-31 MED ORDER — CHLORTHALIDONE 25 MG PO TABS: 25.0000 mg | ORAL_TABLET | Freq: Every day | ORAL | 0 refills | Status: DC

## 2022-05-31 MED ORDER — PANTOPRAZOLE SODIUM 40 MG PO TBEC: 40.0000 mg | DELAYED_RELEASE_TABLET | Freq: Two times a day (BID) | ORAL | 0 refills | Status: DC

## 2022-05-31 MED ORDER — LISINOPRIL 20 MG PO TABS: 20.0000 mg | ORAL_TABLET | Freq: Every day | ORAL | 0 refills | Status: DC

## 2022-05-31 MED ORDER — AMOXICILLIN-POT CLAVULANATE 875-125 MG PO TABS: 1.0000 | ORAL_TABLET | Freq: Two times a day (BID) | ORAL | 0 refills | Status: DC

## 2022-05-31 NOTE — Progress Notes (Signed)
Acute Office Visit  Subjective:    Patient ID: Jack Pierce, male    DOB: June 14, 1956, 66 y.o.   MRN: [id number]  Chief Complaint  Patient presents with   Sinusitis   HPI: Patient is in today for sinus symptoms.  Positive for congestion, rhinorrhea, sinus pressure, sinus pain and sore throat.   Hypertension: On chlorthalidone 25 mg daily, lasix 20 mg once daily as needed, lisinopril 20 mg daily,. Prediabetes: eats healthy.  Idiopathic progressive neuropathy: on pregabalin 25 mg three times a day  GERD: on pantoprazole 40 mg twice daily.  Mixed hyperlipidemia: On pravastatin 40 mg daily.   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, mid forehead    Past Surgical History:  Procedure Laterality Date   [place]   1 removed    Family History  Problem Relation Age of Onset   Heart attack Mother    Coronary artery disease Father    CAD Brother    Suicidality Brother    CAD Sister     Social History   Socioeconomic History   Marital status: Married    Spouse name: Not on file   Number of children: 2   Years of education: Not on file   Highest education level: Not on file  Occupational History   Occupation: Retired  Tobacco Use   Smoking status: Never   Smokeless tobacco: Never  Vaping Use   Vaping Use: Never used  Substance and Sexual Activity   Alcohol use: No   Drug use: No   Sexual activity: Not on file  Other Topics Concern   Not on file  Social History Narrative   Not on file   Social Determinants of Health   Financial Resource Strain: Not  on file  Food Insecurity: Not on file  Transportation Needs: Not on file  Physical Activity: Not on file  Stress: Not on file  Social Connections: Not on file  Intimate Partner Violence: Not on file    Outpatient Medications Prior to Visit  Medication Sig Dispense Refill   aspirin 81 MG EC tablet Take 81 mg by mouth 2 (two) times daily.     B Complex-Folic Acid (B COMPLEX-VITAMIN B12 PO) Take 1 tablet by mouth daily.     fluorouracil (EFUDEX) 5 % cream SMARTSIG:Sparingly Topical Twice Daily     fluticasone (FLONASE) 50 MCG/ACT nasal spray Place 2 sprays into both nostrils daily. 16 g 6   furosemide (LASIX) 20 MG tablet Take 1 tablet (20 mg total) by mouth daily as needed. 30 tablet 0   ibuprofen (ADVIL) 600 MG tablet Take 1 tablet (600 mg total) by mouth 3 (three) times daily with meals. 90 tablet 3   lamoTRIgine (LAMICTAL) 100 MG tablet Take 1 tablet by mouth once daily 90 tablet 0   magic mouthwash (nystatin, hydrocortisone, diphenhydrAMINE) suspension Swish and swallow 5 mLs 3 (three) times daily. 540 mL 1   Multiple Vitamins-Minerals (ALIVE MULTI-VITAMIN PO) Take by mouth.     nitroGLYCERIN (NITROSTAT) 0.4 MG SL tablet SMARTSIG:1 Tablet(s)  Sublingual PRN     pravastatin (PRAVACHOL) 40 MG tablet TAKE 1 TABLET BY MOUTH AT BEDTIME 90 tablet 0   pregabalin (LYRICA) 25 MG capsule TAKE 1 CAPSULE BY MOUTH THREE TIMES DAILY 90 capsule 2   tamsulosin (FLOMAX) 0.4 MG CAPS capsule Take 0.4 mg by mouth daily.     Chlorphen-PE-Acetaminophen (NOREL AD) 4-10-325 MG TABS Take 1 tablet by mouth 2 (two) times daily. 30 tablet 0   chlorthalidone (HYGROTON) 25 MG tablet Take 25 mg by mouth daily.     lisinopril (ZESTRIL) 20 MG tablet Take 1 tablet by mouth once daily 90 tablet 0   pantoprazole (PROTONIX) 40 MG tablet Take 1 tablet by mouth twice daily 180 tablet 0   chlorthalidone (HYGROTON) 25 MG tablet Take 1 tablet by mouth once daily 90 tablet 0   predniSONE (STERAPRED UNI-PAK 21 TAB) 10 MG (21)  TBPK tablet Take 6ills first day , then 5 pills day 2 and then cut down one pill day until gone 21 tablet 0   No facility-administered medications prior to visit.    Allergies  Allergen Reactions   Amlodipine Besylate Swelling   Cozaar [Losartan Potassium] Other (See Comments)    Dizziness   Cefdinir    Diphenhydramine Hcl     Other reaction(s): Other (see comments) Patient feels loopy   Meloxicam Other (See Comments)    Dizziness   Doxycycline Anxiety    Review of Systems  Constitutional:  Negative for appetite change, fatigue and fever.  HENT:  Positive for congestion, rhinorrhea, sinus pressure, sinus pain and sore throat. Negative for ear pain.   Respiratory:  Negative for cough, chest tightness, shortness of breath and wheezing.   Cardiovascular:  Negative for chest pain and palpitations.  Gastrointestinal:  Negative for abdominal pain, constipation, diarrhea, nausea and vomiting.  Genitourinary:  Positive for dysuria. Negative for hematuria.  Musculoskeletal:  Negative for arthralgias, back pain, joint swelling and myalgias.  Skin:  Negative for rash.  Neurological:  Negative for dizziness, weakness and headaches.  Psychiatric/Behavioral:  Negative for dysphoric mood. The patient is not nervous/anxious.        Objective:    Physical Exam Vitals reviewed.  Constitutional:      Appearance: Normal appearance. He is normal weight.  HENT:     Right Ear: Tympanic membrane normal.     Left Ear: Tympanic membrane normal.     Nose: Congestion present.     Comments: Maxillary sinus tenderness    Mouth/Throat:     Pharynx: Posterior oropharyngeal erythema present. No oropharyngeal exudate.  Cardiovascular:     Rate and Rhythm: Normal rate and regular rhythm.     Heart sounds: Normal heart sounds.  Pulmonary:     Effort: Pulmonary effort is normal.     Breath sounds: Normal breath sounds.  Lymphadenopathy:     Cervical: No cervical adenopathy.  Neurological:      Mental Status: He is alert and oriented to person, place, and time.  Psychiatric:        Mood and Affect: Mood normal.        Behavior: Behavior normal.     BP 130/72 (BP Location: Left Arm, Patient Position: Sitting)   Pulse (!) 58   Temp 97.8 F (36.6 C) (Oral)   Ht 6' (1.829 m)   Wt 215 lb (97.5 kg)   SpO2 99%   BMI 29.16 kg/m  Wt Readings from Last 3 Encounters:  05/31/22 215 lb (97.5 kg)  01/10/22 212  lb (96.2 kg)  11/30/21 213 lb (96.6 kg)    Health Maintenance Due  Topic Date Due   Hepatitis C Screening  Never done   Pneumonia Vaccine 23+ Years old (20 - PPSV23 or PCV20) 07/05/2021   TETANUS/TDAP  08/07/2021   INFLUENZA VACCINE  05/22/2022    There are no preventive care reminders to display for this patient.   Lab Results  Component Value Date   TSH 2.850 02/06/2021   Lab Results  Component Value Date   WBC 5.5 03/13/2021   HGB 12.0 (L) 03/13/2021   HCT 35.5 (L) 03/13/2021   MCV 81 03/13/2021   PLT 199 03/13/2021   Lab Results  Component Value Date   NA 136 03/13/2021   K 4.1 03/13/2021   CO2 26 03/13/2021   GLUCOSE 110 (H) 03/13/2021   BUN 15 03/13/2021   CREATININE 1.39 (H) 03/13/2021   BILITOT 0.5 03/13/2021   ALKPHOS 84 03/13/2021   AST 22 03/13/2021   ALT 15 03/13/2021   PROT 7.6 03/13/2021   ALBUMIN 4.7 03/13/2021   CALCIUM 9.6 03/13/2021   EGFR 56 (L) 03/13/2021   Lab Results  Component Value Date   CHOL 143 06/29/2020   Lab Results  Component Value Date   HDL 53 06/29/2020   Lab Results  Component Value Date   LDLCALC 76 06/29/2020   Lab Results  Component Value Date   TRIG 70 06/29/2020   Lab Results  Component Value Date   CHOLHDL 2.7 06/29/2020   Lab Results  Component Value Date   HGBA1C 6.2 (H) 02/06/2021         Assessment & Plan:   Problem List Items Addressed This Visit       Cardiovascular and Mediastinum   Essential hypertension, benign    Well controlled.  No changes to medicines. Continue  lisinopril 20 mg daily and chlorthalidone 25 mg daily.  Continue to work on eating a healthy diet and exercise.  Return for labs.        Relevant Medications   lisinopril (ZESTRIL) 20 MG tablet   chlorthalidone (HYGROTON) 25 MG tablet     Respiratory   Acute sinusitis - Primary    Rx: augmentin given.       Relevant Medications   amoxicillin-clavulanate (AUGMENTIN) 875-125 MG tablet     Digestive   Gastro-esophageal reflux disease without esophagitis    The current medical regimen is effective;  continue present plan and medications. Continue protonix 40 mg twice daily.       Relevant Medications   pantoprazole (PROTONIX) 40 MG tablet     Nervous and Auditory   Idiopathic progressive neuropathy    The current medical regimen is effective;  continue present plan and medications. Continue lyrica 25 mg three times a day.         Other   Mixed hyperlipidemia    Well controlled.  No changes to medicines. Continue pravastatin 40 mg daily.  Continue to work on eating a healthy diet and exercise.  Return for labs.       Relevant Medications   lisinopril (ZESTRIL) 20 MG tablet   chlorthalidone (HYGROTON) 25 MG tablet   Dysuria    UA normal.      Relevant Orders   POCT URINALYSIS DIP (CLINITEK) (Completed)   Follow up: for fasting labs.   Rochel Brome, MD

## 2022-06-08 DIAGNOSIS — R300 Dysuria: Secondary | ICD-10-CM | POA: Insufficient documentation

## 2022-06-08 NOTE — Assessment & Plan Note (Signed)
UA normal.

## 2022-06-08 NOTE — Assessment & Plan Note (Signed)
Rx: augmentin given.

## 2022-06-08 NOTE — Assessment & Plan Note (Signed)
The current medical regimen is effective;  continue present plan and medications. Continue lyrica 25 mg three times a day.

## 2022-06-08 NOTE — Assessment & Plan Note (Addendum)
Well controlled.  No changes to medicines. Continue lisinopril 20 mg daily and chlorthalidone 25 mg daily.  Continue to work on eating a healthy diet and exercise.  Return for labs.

## 2022-06-08 NOTE — Assessment & Plan Note (Addendum)
Well controlled.  No changes to medicines. Continue pravastatin 40 mg daily.  Continue to work on eating a healthy diet and exercise.  Return for labs.

## 2022-06-08 NOTE — Assessment & Plan Note (Signed)
The current medical regimen is effective;  continue present plan and medications. Continue protonix 40 mg twice daily.  

## 2022-06-14 ENCOUNTER — Telehealth: Payer: Self-pay | Admitting: Family Medicine

## 2022-06-14 ENCOUNTER — Other Ambulatory Visit: Payer: Medicare Other

## 2022-06-14 DIAGNOSIS — I10 Essential (primary) hypertension: Secondary | ICD-10-CM

## 2022-06-14 DIAGNOSIS — E782 Mixed hyperlipidemia: Secondary | ICD-10-CM

## 2022-06-14 DIAGNOSIS — R7301 Impaired fasting glucose: Secondary | ICD-10-CM

## 2022-06-14 NOTE — Telephone Encounter (Signed)
Scheduled AWV over the phone on 06/26/2022 at 1pm  Patient will call if he needs to reschedule.

## 2022-06-15 LAB — COMPREHENSIVE METABOLIC PANEL
ALT: 15 IU/L (ref 0–44)
AST: 24 IU/L (ref 0–40)
Albumin/Globulin Ratio: 1.8 (ref 1.2–2.2)
Albumin: 4.8 g/dL (ref 3.9–4.9)
Alkaline Phosphatase: 81 IU/L (ref 44–121)
BUN/Creatinine Ratio: 11 (ref 10–24)
BUN: 16 mg/dL (ref 8–27)
Bilirubin Total: 0.7 mg/dL (ref 0.0–1.2)
CO2: 25 mmol/L (ref 20–29)
Calcium: 9.5 mg/dL (ref 8.6–10.2)
Chloride: 95 mmol/L — ABNORMAL LOW (ref 96–106)
Creatinine, Ser: 1.42 mg/dL — ABNORMAL HIGH (ref 0.76–1.27)
Globulin, Total: 2.6 g/dL (ref 1.5–4.5)
Glucose: 102 mg/dL — ABNORMAL HIGH (ref 70–99)
Potassium: 3.9 mmol/L (ref 3.5–5.2)
Sodium: 135 mmol/L (ref 134–144)
Total Protein: 7.4 g/dL (ref 6.0–8.5)
eGFR: 54 mL/min/{1.73_m2} — ABNORMAL LOW (ref >59)

## 2022-06-15 LAB — CBC WITH DIFFERENTIAL/PLATELET
Basophils Absolute: 0.1 10*3/uL (ref 0.0–0.2)
Basos: 1 %
EOS (ABSOLUTE): 0.3 10*3/uL (ref 0.0–0.4)
Eos: 6 %
Hematocrit: 38.5 % (ref 37.5–51.0)
Hemoglobin: 12.9 g/dL — ABNORMAL LOW (ref 13.0–17.7)
Immature Grans (Abs): 0 10*3/uL (ref 0.0–0.1)
Immature Granulocytes: 0 %
Lymphocytes Absolute: 1.3 10*3/uL (ref 0.7–3.1)
Lymphs: 25 %
MCH: 28 pg (ref 26.6–33.0)
MCHC: 33.5 g/dL (ref 31.5–35.7)
MCV: 84 fL (ref 79–97)
Monocytes Absolute: 0.7 10*3/uL (ref 0.1–0.9)
Monocytes: 13 %
Neutrophils Absolute: 2.9 10*3/uL (ref 1.4–7.0)
Neutrophils: 55 %
Platelets: 171 10*3/uL (ref 150–450)
RBC: 4.61 x10E6/uL (ref 4.14–5.80)
RDW: 13 % (ref 11.6–15.4)
WBC: 5.2 10*3/uL (ref 3.4–10.8)

## 2022-06-15 LAB — LIPID PANEL
Chol/HDL Ratio: 2.8 ratio (ref 0.0–5.0)
Cholesterol, Total: 133 mg/dL (ref 100–199)
HDL: 48 mg/dL (ref >39)
LDL Chol Calc (NIH): 69 mg/dL (ref 0–99)
Triglycerides: 84 mg/dL (ref 0–149)
VLDL Cholesterol Cal: 16 mg/dL (ref 5–40)

## 2022-06-15 LAB — CARDIOVASCULAR RISK ASSESSMENT

## 2022-06-15 LAB — HEMOGLOBIN A1C
Est. average glucose Bld gHb Est-mCnc: 128 mg/dL
Hgb A1c MFr Bld: 6.1 % — ABNORMAL HIGH (ref 4.8–5.6)

## 2022-06-17 NOTE — Progress Notes (Signed)
Blood count abnormal. Hb improved and nearly normal.  Liver function normal.  Kidney function abnormal. Little worse. Recommend recheck in 2 weeks.  Cholesterol: good. HBA1C: 6.1.

## 2022-06-18 ENCOUNTER — Other Ambulatory Visit: Payer: Self-pay

## 2022-06-18 DIAGNOSIS — R944 Abnormal results of kidney function studies: Secondary | ICD-10-CM

## 2022-06-26 ENCOUNTER — Encounter: Payer: Medicare Other | Admitting: Family Medicine

## 2022-06-27 ENCOUNTER — Other Ambulatory Visit: Payer: Self-pay | Admitting: Family Medicine

## 2022-06-28 ENCOUNTER — Encounter: Payer: Medicare Other | Admitting: Family Medicine

## 2022-06-28 ENCOUNTER — Telehealth: Payer: Self-pay | Admitting: Family Medicine

## 2022-06-28 NOTE — Telephone Encounter (Signed)
No answer for AWV, LVM to call office to reschedule.

## 2022-07-02 ENCOUNTER — Other Ambulatory Visit: Payer: Medicare Other

## 2022-07-02 ENCOUNTER — Other Ambulatory Visit: Payer: Self-pay

## 2022-07-02 DIAGNOSIS — K219 Gastro-esophageal reflux disease without esophagitis: Secondary | ICD-10-CM

## 2022-07-02 DIAGNOSIS — R944 Abnormal results of kidney function studies: Secondary | ICD-10-CM

## 2022-07-02 LAB — COMPREHENSIVE METABOLIC PANEL
ALT: 18 IU/L (ref 0–44)
AST: 25 IU/L (ref 0–40)
Albumin/Globulin Ratio: 1.7 (ref 1.2–2.2)
Albumin: 4.5 g/dL (ref 3.9–4.9)
Alkaline Phosphatase: 86 IU/L (ref 44–121)
BUN/Creatinine Ratio: 11 (ref 10–24)
BUN: 15 mg/dL (ref 8–27)
Bilirubin Total: 0.5 mg/dL (ref 0.0–1.2)
CO2: 26 mmol/L (ref 20–29)
Calcium: 9.6 mg/dL (ref 8.6–10.2)
Chloride: 99 mmol/L (ref 96–106)
Creatinine, Ser: 1.32 mg/dL — ABNORMAL HIGH (ref 0.76–1.27)
Globulin, Total: 2.6 g/dL (ref 1.5–4.5)
Glucose: 114 mg/dL — ABNORMAL HIGH (ref 70–99)
Potassium: 4.2 mmol/L (ref 3.5–5.2)
Sodium: 138 mmol/L (ref 134–144)
Total Protein: 7.1 g/dL (ref 6.0–8.5)
eGFR: 59 mL/min/{1.73_m2} — ABNORMAL LOW (ref >59)

## 2022-07-02 MED ORDER — PRAVASTATIN SODIUM 40 MG PO TABS: 40.0000 mg | ORAL_TABLET | Freq: Every day | ORAL | 0 refills | Status: DC

## 2022-07-04 NOTE — Progress Notes (Signed)
Liver function normal.  Kidney function abnormal. Stable.

## 2022-07-31 ENCOUNTER — Other Ambulatory Visit: Payer: Self-pay

## 2022-07-31 MED ORDER — LAMOTRIGINE 100 MG PO TABS: 100.0000 mg | ORAL_TABLET | Freq: Every day | ORAL | 2 refills | Status: DC

## 2022-08-08 ENCOUNTER — Encounter: Payer: Self-pay | Admitting: Physician Assistant

## 2022-08-08 ENCOUNTER — Ambulatory Visit (INDEPENDENT_AMBULATORY_CARE_PROVIDER_SITE_OTHER): Payer: Medicare Other | Admitting: Physician Assistant

## 2022-08-08 VITALS — BP 120/78 | HR 65 | Temp 97.5°F | Ht 72.0 in | Wt 210.4 lb

## 2022-08-08 DIAGNOSIS — I10 Essential (primary) hypertension: Secondary | ICD-10-CM | POA: Diagnosis not present

## 2022-08-08 DIAGNOSIS — T783XXA Angioneurotic edema, initial encounter: Secondary | ICD-10-CM | POA: Diagnosis not present

## 2022-08-08 MED ORDER — PREDNISONE 20 MG PO TABS: ORAL_TABLET | ORAL | 0 refills | Status: AC

## 2022-08-08 MED ORDER — TRIAMCINOLONE ACETONIDE 40 MG/ML IJ SUSP: 80.0000 mg | Freq: Once | INTRAMUSCULAR | Status: DC

## 2022-08-08 MED ORDER — METOPROLOL SUCCINATE ER 25 MG PO TB24: 25.0000 mg | ORAL_TABLET | Freq: Every day | ORAL | 1 refills | Status: DC

## 2022-08-08 NOTE — Progress Notes (Signed)
Acute Office Visit  Subjective:    Patient ID: Jack Pierce, male    DOB: 29-Nov-1955, 66 y.o.   MRN: [id number]  Chief Complaint  Patient presents with   Oral Swelling    HPI: Patient is in today for complaints of swelling of both lips.  He states he woke up with symptoms.  Denies uri issues.  States that this has happened to him in past and possibly from bp meds.  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, mid forehead    Past Surgical History:  Procedure Laterality Date   [place]   1 removed    Family History  Problem Relation Age of Onset   Heart attack Mother    Coronary artery disease Father    CAD Brother    Suicidality Brother    CAD Sister     Social History   Socioeconomic History   Marital status: Married    Spouse name: Not on file   Number of children: 2   Years of education: Not on file   Highest education level: Not on file  Occupational History   Occupation: Retired  Tobacco Use   Smoking status: Never   Smokeless tobacco: Never  Vaping Use   Vaping Use: Never used  Substance and Sexual Activity   Alcohol use: No   Drug use: No   Sexual activity: Not on file  Other Topics Concern   Not on file  Social History Narrative   Not on file   Social Determinants of Health   Financial Resource Strain: Not on file  Food Insecurity: Not on file   Transportation Needs: Not on file  Physical Activity: Not on file  Stress: Not on file  Social Connections: Not on file  Intimate Partner Violence: Not on file    Outpatient Medications Prior to Visit  Medication Sig Dispense Refill   aspirin 81 MG EC tablet Take 81 mg by mouth 2 (two) times daily.     chlorthalidone (HYGROTON) 25 MG tablet Take 1 tablet (25 mg total) by mouth daily. 90 tablet 0   fluticasone (FLONASE) 50 MCG/ACT nasal spray Place 2 sprays into both nostrils daily. 16 g 6   ibuprofen (ADVIL) 600 MG tablet Take 1 tablet (600 mg total) by mouth 3 (three) times daily with meals. 90 tablet 3   lamoTRIgine (LAMICTAL) 100 MG tablet Take 1 tablet (100 mg total) by mouth daily. 90 tablet 2   nitroGLYCERIN (NITROSTAT) 0.4 MG SL tablet SMARTSIG:1 Tablet(s) Sublingual PRN     pantoprazole (PROTONIX) 40 MG tablet Take 1 tablet (40 mg total) by mouth 2 (two) times daily. 180 tablet 0   pravastatin (PRAVACHOL) 40 MG tablet Take 1 tablet (40 mg total) by mouth at bedtime. 90 tablet 0   pregabalin (LYRICA) 25 MG capsule TAKE 1 CAPSULE BY MOUTH THREE TIMES DAILY  90 capsule 2   tamsulosin (FLOMAX) 0.4 MG CAPS capsule Take 0.4 mg by mouth daily.     B Complex-Folic Acid (B COMPLEX-VITAMIN B12 PO) Take 1 tablet by mouth daily.     fluorouracil (EFUDEX) 5 % cream SMARTSIG:Sparingly Topical Twice Daily     furosemide (LASIX) 20 MG tablet Take 1 tablet (20 mg total) by mouth daily as needed. 30 tablet 0   lisinopril (ZESTRIL) 20 MG tablet Take 1 tablet (20 mg total) by mouth daily. 90 tablet 0   Multiple Vitamins-Minerals (ALIVE MULTI-VITAMIN PO) Take by mouth.     amoxicillin-clavulanate (AUGMENTIN) 875-125 MG tablet Take 1 tablet by mouth 2 (two) times daily. 20 tablet 0   magic mouthwash (nystatin, hydrocortisone, diphenhydrAMINE) suspension Swish and swallow 5 mLs 3 (three) times daily. 540 mL 1   No facility-administered medications prior to visit.    Allergies  Allergen Reactions    Amlodipine Besylate Swelling   Cozaar [Losartan Potassium] Other (See Comments)    Dizziness   Cefdinir    Diphenhydramine Hcl     Other reaction(s): Other (see comments) Patient feels loopy   Meloxicam Other (See Comments)    Dizziness   Doxycycline Anxiety    Review of Systems CONSTITUTIONAL: Negative for chills, fatigue, fever, E/N/T: see HPI CARDIOVASCULAR: Negative for chest pain, RESPIRATORY: Negative for recent cough and dyspnea.       Objective:  PHYSICAL EXAM:   VS: BP 120/78 (BP Location: Left Arm, Patient Position: Sitting, Cuff Size: Large)   Pulse 65   Temp (!) 97.5 F (36.4 C) (Temporal)   Ht 6' (1.829 m)   Wt 210 lb 6.4 oz (95.4 kg)   SpO2 100%   BMI 28.54 kg/m   GEN: Well nourished, well developed, in no acute distress  HEENT: normal external ears and nose - normal external auditory canals and TMS - - both lips with swelling noted Oropharynx - normal mucosa, palate, and posterior pharynx  Cardiac: RRR; no murmurs,  Respiratory:  normal respiratory rate and pattern with no distress - normal breath sounds with no rales, rhonchi, wheezes or rubs     There are no preventive care reminders to display for this patient.   Lab Results  Component Value Date   TSH 2.850 02/06/2021   Lab Results  Component Value Date   WBC 5.2 06/14/2022   HGB 12.9 (L) 06/14/2022   HCT 38.5 06/14/2022   MCV 84 06/14/2022   PLT 171 06/14/2022   Lab Results  Component Value Date   NA 138 07/02/2022   K 4.2 07/02/2022   CO2 26 07/02/2022   GLUCOSE 114 (H) 07/02/2022   BUN 15 07/02/2022   CREATININE 1.32 (H) 07/02/2022   BILITOT 0.5 07/02/2022   ALKPHOS 86 07/02/2022   AST 25 07/02/2022   ALT 18 07/02/2022   PROT 7.1 07/02/2022   ALBUMIN 4.5 07/02/2022   CALCIUM 9.6 07/02/2022   EGFR 59 (L) 07/02/2022   Lab Results  Component Value Date   CHOL 133 06/14/2022   Lab Results  Component Value Date   HDL 48 06/14/2022   Lab Results  Component Value Date    LDLCALC 69 06/14/2022   Lab Results  Component Value Date   TRIG 84 06/14/2022   Lab Results  Component Value Date   CHOLHDL 2.8 06/14/2022   Lab Results  Component Value Date   HGBA1C 6.1 (H) 06/14/2022       Assessment & Plan:   Problem List Items  Addressed This Visit       Cardiovascular and Mediastinum   Essential hypertension, benign   Relevant Medications   metoprolol succinate (TOPROL-XL) 25 MG 24 hr tablet Stop  lisinopril     Other   Angio-edema - Primary   Relevant Medications   triamcinolone acetonide (KENALOG-40) injection 80 mg   predniSONE (DELTASONE) 20 MG tablet   Meds ordered this encounter  Medications   triamcinolone acetonide (KENALOG-40) injection 80 mg   predniSONE (DELTASONE) 20 MG tablet    Sig: Take 3 tablets (60 mg total) by mouth daily with breakfast for 3 days, THEN 2 tablets (40 mg total) daily with breakfast for 3 days, THEN 1 tablet (20 mg total) daily with breakfast for 3 days.    Dispense:  18 tablet    Refill:  0    Order Specific Question:   Supervising Provider    Answer:   Shelton Silvas   metoprolol succinate (TOPROL-XL) 25 MG 24 hr tablet    Sig: Take 1 tablet (25 mg total) by mouth daily.    Dispense:  30 tablet    Refill:  1    Order Specific Question:   Supervising Provider    Answer:   Shelton Silvas    No orders of the defined types were placed in this encounter.    Follow-up: Return in about 1 week (around 08/15/2022) for blood pressure and pulse check - nurse visit.  An After Visit Summary was printed and given to the patient.  Yetta Flock [facility] [phone]

## 2022-08-27 ENCOUNTER — Ambulatory Visit (INDEPENDENT_AMBULATORY_CARE_PROVIDER_SITE_OTHER): Payer: Medicare Other

## 2022-08-27 ENCOUNTER — Other Ambulatory Visit: Payer: Self-pay

## 2022-08-27 ENCOUNTER — Other Ambulatory Visit: Payer: Self-pay | Admitting: Family Medicine

## 2022-08-27 VITALS — BP 122/72 | HR 51

## 2022-08-27 DIAGNOSIS — I10 Essential (primary) hypertension: Secondary | ICD-10-CM

## 2022-08-27 DIAGNOSIS — Z23 Encounter for immunization: Secondary | ICD-10-CM | POA: Diagnosis not present

## 2022-08-27 DIAGNOSIS — K219 Gastro-esophageal reflux disease without esophagitis: Secondary | ICD-10-CM

## 2022-08-27 MED ORDER — PANTOPRAZOLE SODIUM 40 MG PO TBEC: 40.0000 mg | DELAYED_RELEASE_TABLET | Freq: Two times a day (BID) | ORAL | 1 refills | Status: DC

## 2022-08-27 MED ORDER — PRAVASTATIN SODIUM 40 MG PO TABS: 40.0000 mg | ORAL_TABLET | Freq: Every day | ORAL | 0 refills | Status: DC

## 2022-08-27 MED ORDER — CHLORTHALIDONE 25 MG PO TABS: 25.0000 mg | ORAL_TABLET | Freq: Every day | ORAL | 0 refills | Status: DC

## 2022-08-27 MED ORDER — METOPROLOL SUCCINATE ER 25 MG PO TB24: 12.5000 mg | ORAL_TABLET | Freq: Every day | ORAL | 0 refills | Status: DC

## 2022-08-27 NOTE — Progress Notes (Unsigned)
Subjective:  Patient ID: Jack Pierce, male    DOB: 1956/05/05  Age: 66 y.o. MRN: [id number]  No chief complaint on file.   HPI Patient here today for a recheck bp, Provider made aware of the bp. Pulse was 51  Per dr Cox patient should take 1/2 of metoprolol and recheck 2 weeks recheck on bp .     Current Outpatient Medications on File Prior to Visit  Medication Sig Dispense Refill   aspirin 81 MG EC tablet Take 81 mg by mouth 2 (two) times daily.     chlorthalidone (HYGROTON) 25 MG tablet Take 1 tablet (25 mg total) by mouth daily. 90 tablet 0   fluticasone (FLONASE) 50 MCG/ACT nasal spray Place 2 sprays into both nostrils daily. 16 g 6   ibuprofen (ADVIL) 600 MG tablet Take 1 tablet (600 mg total) by mouth 3 (three) times daily with meals. 90 tablet 3   lamoTRIgine (LAMICTAL) 100 MG tablet Take 1 tablet (100 mg total) by mouth daily. 90 tablet 2   metoprolol succinate (TOPROL-XL) 25 MG 24 hr tablet Take 1 tablet (25 mg total) by mouth daily. 30 tablet 1   nitroGLYCERIN (NITROSTAT) 0.4 MG SL tablet SMARTSIG:1 Tablet(s) Sublingual PRN     pantoprazole (PROTONIX) 40 MG tablet Take 1 tablet (40 mg total) by mouth 2 (two) times daily. 180 tablet 0   pravastatin (PRAVACHOL) 40 MG tablet Take 1 tablet (40 mg total) by mouth at bedtime. 90 tablet 0   pregabalin (LYRICA) 25 MG capsule TAKE 1 CAPSULE BY MOUTH THREE TIMES DAILY 90 capsule 2   tamsulosin (FLOMAX) 0.4 MG CAPS capsule Take 0.4 mg by mouth daily.     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, mid forehead   Past Surgical History:  Procedure Laterality Date   [place]   1 removed    Family History  Problem Relation Age of Onset   Heart attack Mother    Coronary artery disease Father    CAD Brother    Suicidality Brother    CAD Sister    Social History   Socioeconomic History   Marital status: Married    Spouse name: Not on file   Number of children: 2   Years of education: Not on file   Highest education level: Not on file  Occupational History   Occupation: Retired  Tobacco Use   Smoking status: Never   Smokeless tobacco: Never  Vaping Use   Vaping Use: Never used  Substance and Sexual Activity   Alcohol use: No   Drug use: No   Sexual activity: Not on file  Other Topics Concern   Not on file  Social History Narrative   Not on file   Social Determinants of Health   Financial Resource Strain: Not on  file  Food Insecurity: Not on file  Transportation Needs: Not on file  Physical Activity: Not on file  Stress: Not on file  Social Connections: Not on file    Review of Systems   Objective:  There were no vitals taken for this visit.     08/08/2022    2:52 PM 05/31/2022   11:01 AM 01/10/2022    1:35 PM  BP/Weight  Systolic BP 120 130 102  Diastolic BP 78 72 80  Wt. (Lbs) 210.4 215 212  BMI 28.54 kg/m2 29.16 kg/m2 28.75 kg/m2    Physical Exam  Diabetic Foot Exam - Simple   No data filed      Lab Results  Component Value Date   WBC 5.2 06/14/2022   HGB 12.9 (L) 06/14/2022   HCT 38.5 06/14/2022   PLT 171 06/14/2022   GLUCOSE 114 (H) 07/02/2022   CHOL 133 06/14/2022   TRIG 84 06/14/2022   HDL 48 06/14/2022   LDLCALC 69 06/14/2022   ALT 18 07/02/2022   AST 25 07/02/2022   NA 138 07/02/2022   K 4.2 07/02/2022   CL 99  07/02/2022   CREATININE 1.32 (H) 07/02/2022   BUN 15 07/02/2022   CO2 26 07/02/2022   TSH 2.850 02/06/2021   HGBA1C 6.1 (H) 06/14/2022      Assessment & Plan:   Problem List Items Addressed This Visit   None .  No orders of the defined types were placed in this encounter.   No orders of the defined types were placed in this encounter.    Follow-up: No follow-ups on file.  An After Visit Summary was printed and given to the patient.  [facility] [phone]

## 2022-08-29 ENCOUNTER — Telehealth: Payer: Self-pay

## 2022-08-29 NOTE — Telephone Encounter (Signed)
Patient called stating he had a flu shot yesterday, since then he has been hoarse and his chest is tight when coughing. Per Dr. Sedalia Muta, patient is to gargle with warm salt water and can take mucinex, patient aware and verbalized understanding.

## 2022-09-07 ENCOUNTER — Telehealth: Payer: Self-pay

## 2022-09-07 NOTE — Telephone Encounter (Signed)
Wife called stating patient has appt on Monday with Dr. Sedalia Muta however he does not have a cough or fever but is hoarse, has a headache and pressure behind his eyes wanted to know if a ax bx could be called in until Monday. Informed wife that a ax bx could not be called in until patient is evaluated, suggested if they felt he could not wait they could do a televisit with [facility] or go to the Urgent Care. Wife emphasized it was only sinus issues, reiterated that patient could do a televisit via [facility] or go to Urgent Care if they felt he needed treatment prior to his appt on Monday.

## 2022-09-10 ENCOUNTER — Ambulatory Visit: Payer: Medicare Other

## 2022-09-10 ENCOUNTER — Telehealth: Payer: Self-pay | Admitting: Family Medicine

## 2022-09-10 NOTE — Telephone Encounter (Signed)
error 

## 2022-09-11 ENCOUNTER — Telehealth: Payer: Self-pay | Admitting: Nurse Practitioner

## 2022-09-11 ENCOUNTER — Encounter: Payer: Self-pay | Admitting: Nurse Practitioner

## 2022-09-11 ENCOUNTER — Telehealth (INDEPENDENT_AMBULATORY_CARE_PROVIDER_SITE_OTHER): Payer: Medicare Other | Admitting: Nurse Practitioner

## 2022-09-11 VITALS — HR 59

## 2022-09-11 DIAGNOSIS — R509 Fever, unspecified: Secondary | ICD-10-CM

## 2022-09-11 DIAGNOSIS — U071 COVID-19: Secondary | ICD-10-CM

## 2022-09-11 DIAGNOSIS — R051 Acute cough: Secondary | ICD-10-CM

## 2022-09-11 LAB — POCT INFLUENZA A/B
Influenza A, POC: NEGATIVE
Influenza B, POC: NEGATIVE

## 2022-09-11 LAB — POC COVID19 BINAXNOW: SARS Coronavirus 2 Ag: POSITIVE — AB

## 2022-09-11 MED ORDER — MOLNUPIRAVIR EUA 200MG CAPSULE: 4.0000 | ORAL_CAPSULE | Freq: Two times a day (BID) | ORAL | 0 refills | Status: DC

## 2022-09-11 MED ORDER — PROMETHAZINE-DM 6.25-15 MG/5ML PO SYRP: 5.0000 mL | ORAL_SOLUTION | Freq: Four times a day (QID) | ORAL | 0 refills | Status: DC | PRN

## 2022-09-11 MED ORDER — NIRMATRELVIR/RITONAVIR (PAXLOVID)TABLET: 3.0000 | ORAL_TABLET | Freq: Two times a day (BID) | ORAL | 0 refills | Status: AC

## 2022-09-11 NOTE — Progress Notes (Signed)
Acute Office Visit  Subjective:    Patient ID: Jack Pierce, male    DOB: [birth year], 66 y.o.   MRN: [id number]  Chief Complaint  Patient presents with   Covid Positive    HPI: Patient is in today for  fever, sinus congestion, bilateral ear pain and sore throat. Onset of symptoms was five days ago. States he was seen at [facility] on 09/07/22 at the onset of symptoms, COVID-19 and flu tests were negative Patient tested positive for Covid-19 today in office parking lot. Treatment has included Mucinex OTC.   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, mid forehead    Past Surgical History:  Procedure Laterality Date   [place]   1 removed    Family History  Problem Relation Age of Onset   Heart attack Mother    Coronary artery disease Father    CAD Brother    Suicidality Brother    CAD Sister     Social History   Socioeconomic History   Marital status: Married    Spouse name: Not on file   Number of children: 2   Years of education: Not on file   Highest education level: Not on file  Occupational History   Occupation: Retired  Tobacco Use   Smoking status: Never   Smokeless tobacco: Never  Vaping Use   Vaping Use: Never used  Substance and Sexual Activity   Alcohol use: No   Drug use: No   Sexual activity: Not on file  Other Topics Concern   Not on file  Social History Narrative   Not on file   Social Determinants of Health   Financial Resource Strain: Not on file  Food Insecurity: Not on file  Transportation Needs: Not on file  Physical Activity:  Not on file  Stress: Not on file  Social Connections: Not on file  Intimate Partner Violence: Not on file    Outpatient Medications Prior to Visit  Medication Sig Dispense Refill   aspirin 81 MG EC tablet Take 81 mg by mouth 2 (two) times daily.     chlorthalidone (HYGROTON) 25 MG tablet Take 1 tablet (25 mg total) by mouth daily. 90 tablet 0   fluticasone (FLONASE) 50 MCG/ACT nasal spray Place 2 sprays into both nostrils daily. 16 g 6   ibuprofen (ADVIL) 600 MG tablet Take 1 tablet (600 mg total) by mouth 3 (three) times daily with meals. 90 tablet 3   lamoTRIgine (LAMICTAL) 100 MG tablet Take 1 tablet (100 mg total) by mouth daily. 90 tablet 2   metoprolol succinate (TOPROL-XL) 25 MG 24 hr tablet Take 0.5 tablets (12.5 mg total) by mouth daily. 1 tablet 0   nitroGLYCERIN (NITROSTAT) 0.4 MG SL tablet SMARTSIG:1 Tablet(s) Sublingual PRN     pantoprazole (PROTONIX) 40 MG tablet Take 1 tablet (40 mg total) by mouth 2 (two) times daily. 180 tablet 1   pravastatin (PRAVACHOL) 40 MG tablet Take 1 tablet (40 mg total) by mouth at bedtime. 90 tablet 0   pregabalin (LYRICA) 25  MG capsule TAKE 1 CAPSULE BY MOUTH THREE TIMES DAILY 90 capsule 2   tamsulosin (FLOMAX) 0.4 MG CAPS capsule Take 0.4 mg by mouth daily.     Facility-Administered Medications Prior to Visit  Medication Dose Route Frequency Provider Last Rate Last Admin   triamcinolone acetonide (KENALOG-40) injection 80 mg  80 mg Intramuscular Once Marge Duncans, PA-C        Allergies  Allergen Reactions   Amlodipine Besylate Swelling   Cozaar [Losartan Potassium] Other (See Comments)    Dizziness   Cefdinir    Diphenhydramine Hcl     Other reaction(s): Other (see comments) Patient feels loopy   Meloxicam Other (See Comments)    Dizziness   Doxycycline Anxiety    Review of Systems  Constitutional:  Positive for fever. Negative for chills and fatigue.  HENT:  Positive for congestion and ear pain (bilateral). Negative for sore  throat.   Respiratory:  Negative for cough and shortness of breath.   Gastrointestinal:  Negative for abdominal pain, constipation, diarrhea, nausea and vomiting.  Endocrine: Negative for polydipsia, polyphagia and polyuria.  Genitourinary:  Negative for dysuria, frequency and urgency.  Musculoskeletal:  Negative for arthralgias and back pain.  Neurological:  Negative for dizziness and headaches.  Psychiatric/Behavioral:  Negative for dysphoric mood. The patient is not nervous/anxious.        Objective:    Physical ExamNo physical exam due to telemedicine visit  Pulse (!) 59   SpO2 97%    Wt Readings from Last 3 Encounters:  08/08/22 210 lb 6.4 oz (95.4 kg)  05/31/22 215 lb (97.5 kg)  01/10/22 212 lb (96.2 kg)    Health Maintenance Due  Topic Date Due   Medicare Annual Wellness (AWV)  Never done   Hepatitis C Screening  Never done   Pneumonia Vaccine 83+ Years old (3 - PPSV23 or PCV20) 07/05/2021       Lab Results  Component Value Date   TSH 2.850 02/06/2021   Lab Results  Component Value Date   WBC 5.2 06/14/2022   HGB 12.9 (L) 06/14/2022   HCT 38.5 06/14/2022   MCV 84 06/14/2022   PLT 171 06/14/2022   Lab Results  Component Value Date   NA 138 07/02/2022   K 4.2 07/02/2022   CO2 26 07/02/2022   GLUCOSE 114 (H) 07/02/2022   BUN 15 07/02/2022   CREATININE 1.32 (H) 07/02/2022   BILITOT 0.5 07/02/2022   ALKPHOS 86 07/02/2022   AST 25 07/02/2022   ALT 18 07/02/2022   PROT 7.1 07/02/2022   ALBUMIN 4.5 07/02/2022   CALCIUM 9.6 07/02/2022   EGFR 59 (L) 07/02/2022   Lab Results  Component Value Date   CHOL 133 06/14/2022   Lab Results  Component Value Date   HDL 48 06/14/2022   Lab Results  Component Value Date   LDLCALC 69 06/14/2022   Lab Results  Component Value Date   TRIG 84 06/14/2022   Lab Results  Component Value Date   CHOLHDL 2.8 06/14/2022   Lab Results  Component Value Date   HGBA1C 6.1 (H) 06/14/2022       Assessment &  Plan:   1. COVID-19 - nirmatrelvir/ritonavir EUA (PAXLOVID) 20 x 150 MG & 10 x 100MG TABS; Take 3 tablets by mouth 2 (two) times daily for 5 days. (Take nirmatrelvir 150 mg two tablets twice daily for 5 days and ritonavir 100 mg one tablet twice daily for 5 days) Patient GFR is 54  Dispense:  30 tablet; Refill: 0  2. Fever and chills - POC COVID-19 BinaxNow-POSITIVE - POCT Influenza A/B--NEGATIVE  3. Lab test positive for detection of COVID-19 virus  4. Acute cough - promethazine-dextromethorphan (PROMETHAZINE-DM) 6.25-15 MG/5ML syrup; Take 5 mLs by mouth 4 (four) times daily as needed.  Dispense: 118 mL; Refill: 0    Rest and push fluids Continue Flonase nasal spray Take antivirals as prescribed Take Promethazine-DM as needed for cough Seek emergency medical care for any severe worsening symptoms Follow-up as needed       Follow-up: PRN  An After Visit Summary was printed and given to the patient.  I, Rip Harbour, NP, have reviewed all documentation for this visit. The documentation on 09/11/22 for the exam, diagnosis, procedures, and orders are all accurate and complete.    Signed, Rip Harbour, NP [place] [phone]

## 2022-09-11 NOTE — Telephone Encounter (Signed)
Molnupiravir unavailable for treatment of COVID-19. Paxlovid prescription sent to [facility]. Telephoned pt to hold Pravastatin cholesterol medication while taking Paxlovid over the next 5 days. Pt verbalized understanding.

## 2022-09-26 ENCOUNTER — Other Ambulatory Visit: Payer: Self-pay | Admitting: Physician Assistant

## 2022-09-26 DIAGNOSIS — I10 Essential (primary) hypertension: Secondary | ICD-10-CM

## 2022-10-18 ENCOUNTER — Other Ambulatory Visit: Payer: Self-pay

## 2022-10-18 DIAGNOSIS — I10 Essential (primary) hypertension: Secondary | ICD-10-CM

## 2022-10-18 MED ORDER — METOPROLOL SUCCINATE ER 25 MG PO TB24: 25.0000 mg | ORAL_TABLET | Freq: Every day | ORAL | 0 refills | Status: DC

## 2022-11-01 ENCOUNTER — Other Ambulatory Visit: Payer: Self-pay | Admitting: Family Medicine

## 2022-11-01 DIAGNOSIS — G603 Idiopathic progressive neuropathy: Secondary | ICD-10-CM

## 2022-11-21 ENCOUNTER — Other Ambulatory Visit: Payer: Self-pay | Admitting: Family Medicine

## 2022-11-21 DIAGNOSIS — I10 Essential (primary) hypertension: Secondary | ICD-10-CM

## 2022-11-22 ENCOUNTER — Telehealth: Payer: Self-pay

## 2022-11-22 NOTE — Telephone Encounter (Signed)
Patient called and left a message and stated that his lips are swollen and his throat is dry. Wanted to come in today to see a provider.  I called patient back and spoke with him he stated his lips are swollen, throat had a funny feeling and he feels like it is closing up.  Recommend patient go to ER or urgent care to be seen as soon as possible.  Patient than stated the last time it happen he was able to walk in office and be seen and got a shot, he has had an stroke in the past and wanted to see Dr. Tobie Poet  Spoke with Marge Duncans, PA-C she recommend he go be seen at ER due to symptoms.  Patient decline and stated he wanted to see Dr.Cox had to say about him coming in today.  Per Dr. Tobie Poet she recommend that patient go to ED and if he feels his throat is closing up he needs to call EMS.  Patient made aware, laughed and hung up phone.

## 2022-11-23 NOTE — Telephone Encounter (Signed)
Patient called and stated he was seen at [facility], He stated that they recommend he gets a referral from his PCP for allergy specialist. He stated all medication they have started him on.  I have called [facility] to request medical records.

## 2022-11-28 ENCOUNTER — Ambulatory Visit (INDEPENDENT_AMBULATORY_CARE_PROVIDER_SITE_OTHER): Payer: Medicare Other

## 2022-11-28 DIAGNOSIS — Z0000 Encounter for general adult medical examination without abnormal findings: Secondary | ICD-10-CM

## 2022-11-28 NOTE — Progress Notes (Signed)
Subjective:   Jack Pierce is a 67 y.o. male who presents for Medicare Annual/Subsequent preventive examination. I connected with  Jack Pierce on 11/28/22 by a audio enabled telemedicine application and verified that I am speaking with the correct person using two identifiers.  Patient Location: Home  Provider Location: Office/Clinic  I discussed the limitations of evaluation and management by telemedicine. The patient expressed understanding and agreed to proceed.  Cardiac Risk Factors include: advanced age (>11mn, >>29women);obesity (BMI >30kg/m2);dyslipidemia;male gender     Objective:     Current Medications (verified) Outpatient Encounter Medications as of 11/28/2022  Medication Sig   aspirin 81 MG EC tablet Take 81 mg by mouth 2 (two) times daily.   chlorthalidone (HYGROTON) 25 MG tablet Take 1 tablet by mouth once daily   fluticasone (FLONASE) 50 MCG/ACT nasal spray Place 2 sprays into both nostrils daily.   ibuprofen (ADVIL) 600 MG tablet Take 1 tablet (600 mg total) by mouth 3 (three) times daily with meals.   lamoTRIgine (LAMICTAL) 100 MG tablet Take 1 tablet (100 mg total) by mouth daily.   metoprolol succinate (TOPROL-XL) 25 MG 24 hr tablet Take 1 tablet (25 mg total) by mouth daily.   nitroGLYCERIN (NITROSTAT) 0.4 MG SL tablet SMARTSIG:1 Tablet(s) Sublingual PRN   pantoprazole (PROTONIX) 40 MG tablet Take 1 tablet (40 mg total) by mouth 2 (two) times daily.   pravastatin (PRAVACHOL) 40 MG tablet Take 1 tablet (40 mg total) by mouth at bedtime.   pregabalin (LYRICA) 25 MG capsule TAKE 1 CAPSULE BY MOUTH THREE TIMES DAILY   promethazine-dextromethorphan (PROMETHAZINE-DM) 6.25-15 MG/5ML syrup Take 5 mLs by mouth 4 (four) times daily as needed.   tamsulosin (FLOMAX) 0.4 MG CAPS capsule Take 0.4 mg by mouth daily.   Facility-Administered Encounter Medications as of 11/28/2022  Medication   triamcinolone acetonide (KENALOG-40) injection 80 mg    Allergies  (verified) Amlodipine besylate, Cozaar [losartan potassium], Cefdinir, Diphenhydramine hcl, Meloxicam, and Doxycycline   History: 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, mid forehead   Past Surgical History:  Procedure Laterality Date   HGary  1 removed   Family History  Problem Relation Age of Onset   Heart attack Mother    Coronary artery disease Father    CAD Brother    Suicidality Brother    CAD Sister    Social History   Socioeconomic History   Marital status: Married    Spouse name: Not on file   Number of children: 2   Years of education: Not on file   Highest education level: Not on file  Occupational History   Occupation: Retired  Tobacco Use   Smoking status: Never   Smokeless tobacco: Never  Vaping Use   Vaping Use: Never used  Substance and Sexual Activity   Alcohol use: No   Drug use: No   Sexual activity: Not on file  Other Topics Concern   Not on file  Social History Narrative   Not on file   Social Determinants of  Health   Financial Resource Strain: Low Risk  (11/28/2022)   Overall Financial Resource Strain (CARDIA)    Difficulty of Paying Living Expenses: Not very hard  Food Insecurity: No Food Insecurity (11/28/2022)   Hunger Vital Sign    Worried About Running Out of Food in the Last Year: Never true    Ran Out of Food in the Last Year: Never true  Transportation Needs: No Transportation Needs (11/28/2022)   PRAPARE - Hydrologist (Medical): No    Lack of Transportation (Non-Medical): No  Physical  Activity: Inactive (11/28/2022)   Exercise Vital Sign    Days of Exercise per Week: 0 days    Minutes of Exercise per Session: 0 min  Stress: No Stress Concern Present (11/28/2022)   [place]    Feeling of Stress : Not at all  Social Connections: [place] (11/28/2022)   Social Connection and Isolation Panel [NHANES]    Frequency of Communication with Friends and Family: More than three times a week    Frequency of Social Gatherings with Friends and Family: More than three times a week    Attends Religious Services: More than 4 times per year    Active Member of [employer] or Organizations: Yes    Attends Music therapist: More than 4 times per year    Marital Status: Married    Tobacco Counseling Counseling given: Not Answered   Clinical Intake:  Pre-visit preparation completed: Yes Pain : No/denies pain   BMI - recorded: 28.5 Nutritional Status: BMI 25 -29 Overweight Nutritional Risks: None Diabetes: No How often do you need to have someone help you when you read instructions, pamphlets, or other written materials from your doctor or pharmacy?: 1 - Never Interpreter Needed?: No    Activities of Daily Living    11/28/2022    3:22 PM  In your present state of health, do you have any difficulty performing the following activities:  Hearing? 0  Vision? 0  Difficulty concentrating or making decisions? 0  Walking or climbing stairs? 1  Dressing or bathing? 0  Doing errands, shopping? 0  Preparing Food and eating ? N  Using the Toilet? N  In the past six months, have you accidently leaked urine? N  Do you have problems with loss of bowel control? N  Managing your Medications? N  Managing your Finances? N  Housekeeping or managing your Housekeeping? N    Patient Care Team: CoxElnita Maxwell, MD as PCP - General (Family Medicine)  Indicate any recent Medical Services you may have received from  other than [facility] providers in the past year (date may be approximate). Urgent Care    Assessment:   This is a routine wellness examination for Jack Pierce.  Hearing/Vision screen No results found.  Dietary issues and exercise activities discussed: Current Exercise Habits: The patient does not participate in regular exercise at present, Exercise limited by: None identified   Depression Screen    11/28/2022    3:20 PM 06/15/2021    9:15 AM 03/13/2021    2:17 PM 06/27/2020   12:43 PM  PHQ 2/9 Scores  PHQ - 2 Score 0 0 0 0    Fall Risk    11/28/2022    3:21 PM 06/15/2021    9:15 AM 03/13/2021    2:17 PM 06/27/2020   12:43 PM  [place] in the past year? 0 0 0 0  Number falls in past yr: 0 0 0 0  Injury with Fall? 0 0 0 0  Risk for fall due to : No Fall Risks  No Fall Risks No Fall Risks  Follow up Falls evaluation completed;Education provided  Falls evaluation completed Falls prevention discussed    FALL RISK PREVENTION PERTAINING TO THE HOME:  Any stairs in or around the home? No  If so, are there any without handrails? No  Home free of loose throw rugs in walkways, pet beds, electrical cords, etc? Yes  Adequate lighting in your home to reduce risk of falls? Yes   ASSISTIVE DEVICES UTILIZED TO PREVENT FALLS:  Life alert? No  Use of a cane, walker or w/c? No  Grab bars in the bathroom? No  Shower chair or bench in shower? No  Elevated toilet seat or a handicapped toilet? No   Cognitive Function:        11/28/2022    3:23 PM  6CIT Screen  What Year? 0 points  What month? 0 points  What time? 0 points  Count back from 20 0 points  Months in reverse 0 points  Repeat phrase 0 points  Total Score 0 points    Immunizations Immunization History  Administered Date(s) Administered   Fluad Quad(high Dose 65+) 06/23/2019, 09/07/2021, 08/27/2022   Influenza Inj Mdck Quad Pf 06/29/2020   Pneumococcal Conjugate-13 05/31/2015   Pneumococcal Polysaccharide-23 07/05/2016    Tdap 08/08/2011   Zoster Recombinat (Shingrix) 05/06/2017, 07/09/2017    TDAP status: Due, Education has been provided regarding the importance of this vaccine. Advised may receive this vaccine at local pharmacy or Health Dept. Aware to provide a copy of the vaccination record if obtained from local pharmacy or Health Dept. Verbalized acceptance and understanding.  Flu Vaccine status: Up to date  Pneumococcal vaccine status: Due, Education has been provided regarding the importance of this vaccine. Advised may receive this vaccine at local pharmacy or Health Dept. Aware to provide a copy of the vaccination record if obtained from local pharmacy or Health Dept. Verbalized acceptance and understanding.  Covid-19 vaccine status: Declined, Education has been provided regarding the importance of this vaccine but patient still declined. Advised may receive this vaccine at local pharmacy or Health Dept.or vaccine clinic. Aware to provide a copy of the vaccination record if obtained from local pharmacy or Health Dept. Verbalized acceptance and understanding.  Qualifies for Shingles Vaccine? Yes   Zostavax completed No   Shingrix Completed?: Yes  Screening Tests Health Maintenance  Topic Date Due   Medicare Annual Wellness (AWV)  Never done   Hepatitis C Screening  Never done   Pneumonia Vaccine 27+ Years old (3 - PPSV23 or PCV20) 07/05/2021   DTaP/Tdap/Td (2 - Td or Tdap) 08/07/2021   COLONOSCOPY (Pts 45-65yr Insurance coverage will need to be confirmed)  12/01/2025   INFLUENZA VACCINE  Completed   Zoster Vaccines- Shingrix  Completed   HPV VACCINES  Aged Out   COVID-19 Vaccine  Discontinued    Health Maintenance  Health Maintenance Due  Topic Date Due   Medicare Annual Wellness (AWV)  Never done   Hepatitis C Screening  Never done   Pneumonia Vaccine 68 Years old (3 - PPSV23 or PCV20) 07/05/2021   DTaP/Tdap/Td (2 - Td or Tdap) 08/07/2021    Colorectal cancer screening: Type of  screening: Colonoscopy. Completed 2022. Repeat every 5 years  Lung Cancer Screening: (Low Dose CT Chest recommended if Age 68-80years, 30 pack-year currently smoking  OR have quit w/in 15years.) does not qualify.   Lung Cancer Screening Referral: N/A  Additional Screening:  Vision Screening: Recommended annual ophthalmology exams for early detection of glaucoma and other disorders of the eye. Is the patient up to date with their annual eye exam?  No  Who is the provider or what is the name of the office in which the patient attends annual eye exams? Dr Renelda Loma in [place]: Recommended annual dental exams for proper oral hygiene  Community Resource Referral / Chronic Care Management: CRR required this visit?  No   CCM required this visit?  No      Plan:    1- Patient is to get Tetanus vaccine at the pharmacy and PNA vaccine at his next appointment 2- Patient requesting new handicap placard as his will expire in a couple of weeks.  Patient states that he doesn't use it all of the time but has trouble when he has to walk a long distance to get into a store.  I have personally reviewed and noted the following in the patient's chart:   Medical and social history Use of alcohol, tobacco or illicit drugs  Current medications and supplements including opioid prescriptions. Patient is not currently taking opioid prescriptions. Functional ability and status Nutritional status Physical activity Advanced directives List of other physicians Hospitalizations, surgeries, and ER visits in previous 12 months Vitals Screenings to include cognitive, depression, and falls Referrals and appointments  In addition, I have reviewed and discussed with patient certain preventive protocols, quality metrics, and best practice recommendations. A written personalized care plan for preventive services as well as general preventive health recommendations were provided to patient.      Erie Noe, LPN   [id number]

## 2022-11-28 NOTE — Patient Instructions (Signed)
Jack Pierce , Thank you for taking time to come for your Medicare Wellness Visit. I appreciate your ongoing commitment to your health goals. Please review the following plan we discussed and let me know if I can assist you in the future.   Screening recommendations/referrals: Colonoscopy: Due 2027 Recommended yearly ophthalmology/optometry visit for glaucoma screening and checkup Recommended yearly dental visit for hygiene and checkup  Vaccinations: Influenza vaccine: up-to-date Pneumococcal vaccine: Due Tdap vaccine: Due - you can get this at the pharmacy Shingles vaccine: Complete     Preventive Care 65 Years and Older, Male Preventive care refers to lifestyle choices and visits with your health care provider that can promote health and wellness. What does preventive care include? A yearly physical exam. This is also called an annual well check. Dental exams once or twice a year. Routine eye exams. Ask your health care provider how often you should have your eyes checked. Personal lifestyle choices, including: Daily care of your teeth and gums. Regular physical activity. Eating a healthy diet. Avoiding tobacco and drug use. Limiting alcohol use. Practicing safe sex. Taking low doses of aspirin every day. Taking vitamin and mineral supplements as recommended by your health care provider. What happens during an annual well check? The services and screenings done by your health care provider during your annual well check will depend on your age, overall health, lifestyle risk factors, and family history of disease. Counseling  Your health care provider may ask you questions about your: Alcohol use. Tobacco use. Drug use. Emotional well-being. Home and relationship well-being. Sexual activity. Eating habits. History of falls. Memory and ability to understand (cognition). Work and work Statistician. Screening  You may have the following tests or measurements: Height,  weight, and BMI. Blood pressure. Lipid and cholesterol levels. These may be checked every 5 years, or more frequently if you are over 12 years old. Skin check. Lung cancer screening. You may have this screening every year starting at age 72 if you have a 30-pack-year history of smoking and currently smoke or have quit within the past 15 years. Fecal occult blood test (FOBT) of the stool. You may have this test every year starting at age 72. Flexible sigmoidoscopy or colonoscopy. You may have a sigmoidoscopy every 5 years or a colonoscopy every 10 years starting at age 50. Prostate cancer screening. Recommendations will vary depending on your family history and other risks. Hepatitis C blood test. Hepatitis B blood test. Sexually transmitted disease (STD) testing. Diabetes screening. This is done by checking your blood sugar (glucose) after you have not eaten for a while (fasting). You may have this done every 1-3 years. Abdominal aortic aneurysm (AAA) screening. You may need this if you are a current or former smoker. Osteoporosis. You may be screened starting at age 65 if you are at high risk. Talk with your health care provider about your test results, treatment options, and if necessary, the need for more tests. Vaccines  Your health care provider may recommend certain vaccines, such as: Influenza vaccine. This is recommended every year. Tetanus, diphtheria, and acellular pertussis (Tdap, Td) vaccine. You may need a Td booster every 10 years. Zoster vaccine. You may need this after age 67. Pneumococcal 13-valent conjugate (PCV13) vaccine. One dose is recommended after age 41. Pneumococcal polysaccharide (PPSV23) vaccine. One dose is recommended after age 62. Talk to your health care provider about which screenings and vaccines you need and how often you need them. This information is not intended to replace  advice given to you by your health care provider. Make sure you discuss any  questions you have with your health care provider. Document Released: 11/04/2015 Document Revised: 06/27/2016 Document Reviewed: 08/09/2015 Elsevier Interactive Patient Education  2017 [place] Prevention in the Home Falls can cause injuries. They can happen to people of all ages. There are many things you can do to make your home safe and to help prevent falls. What can I do on the outside of my home? Regularly fix the edges of walkways and driveways and fix any cracks. Remove anything that might make you trip as you walk through a door, such as a raised step or threshold. Trim any bushes or trees on the path to your home. Use bright outdoor lighting. Clear any walking paths of anything that might make someone trip, such as rocks or tools. Regularly check to see if handrails are loose or broken. Make sure that both sides of any steps have handrails. Any raised decks and porches should have guardrails on the edges. Have any leaves, snow, or ice cleared regularly. Use sand or salt on walking paths during winter. Clean up any spills in your garage right away. This includes oil or grease spills. What can I do in the bathroom? Use night lights. Install grab bars by the toilet and in the tub and shower. Do not use towel bars as grab bars. Use non-skid mats or decals in the tub or shower. If you need to sit down in the shower, use a plastic, non-slip stool. Keep the floor dry. Clean up any water that spills on the floor as soon as it happens. Remove soap buildup in the tub or shower regularly. Attach bath mats securely with double-sided non-slip rug tape. Do not have throw rugs and other things on the floor that can make you trip. What can I do in the bedroom? Use night lights. Make sure that you have a light by your bed that is easy to reach. Do not use any sheets or blankets that are too big for your bed. They should not hang down onto the floor. Have a firm chair that has side  arms. You can use this for support while you get dressed. Do not have throw rugs and other things on the floor that can make you trip. What can I do in the kitchen? Clean up any spills right away. Avoid walking on wet floors. Keep items that you use a lot in easy-to-reach places. If you need to reach something above you, use a strong step stool that has a grab bar. Keep electrical cords out of the way. Do not use floor polish or wax that makes floors slippery. If you must use wax, use non-skid floor wax. Do not have throw rugs and other things on the floor that can make you trip. What can I do with my stairs? Do not leave any items on the stairs. Make sure that there are handrails on both sides of the stairs and use them. Fix handrails that are broken or loose. Make sure that handrails are as long as the stairways. Check any carpeting to make sure that it is firmly attached to the stairs. Fix any carpet that is loose or worn. Avoid having throw rugs at the top or bottom of the stairs. If you do have throw rugs, attach them to the floor with carpet tape. Make sure that you have a light switch at the top of the stairs and the bottom  of the stairs. If you do not have them, ask someone to add them for you. What else can I do to help prevent falls? Wear shoes that: Do not have high heels. Have rubber bottoms. Are comfortable and fit you well. Are closed at the toe. Do not wear sandals. If you use a stepladder: Make sure that it is fully opened. Do not climb a closed stepladder. Make sure that both sides of the stepladder are locked into place. Ask someone to hold it for you, if possible. Clearly mark and make sure that you can see: Any grab bars or handrails. First and last steps. Where the edge of each step is. Use tools that help you move around (mobility aids) if they are needed. These include: Canes. Walkers. Scooters. Crutches. Turn on the lights when you go into a dark area.  Replace any light bulbs as soon as they burn out. Set up your furniture so you have a clear path. Avoid moving your furniture around. If any of your floors are uneven, fix them. If there are any pets around you, be aware of where they are. Review your medicines with your doctor. Some medicines can make you feel dizzy. This can increase your chance of falling. Ask your doctor what other things that you can do to help prevent falls. This information is not intended to replace advice given to you by your health care provider. Make sure you discuss any questions you have with your health care provider. Document Released: 08/04/2009 Document Revised: 03/15/2016 Document Reviewed: 11/12/2014 Elsevier Interactive Patient Education  2017 [employer].

## 2022-12-06 ENCOUNTER — Ambulatory Visit (INDEPENDENT_AMBULATORY_CARE_PROVIDER_SITE_OTHER): Payer: Medicare Other | Admitting: Family Medicine

## 2022-12-06 VITALS — BP 138/78 | HR 78 | Temp 97.8°F | Ht 72.0 in | Wt 210.0 lb

## 2022-12-06 DIAGNOSIS — T782XXA Anaphylactic shock, unspecified, initial encounter: Secondary | ICD-10-CM | POA: Insufficient documentation

## 2022-12-06 DIAGNOSIS — E782 Mixed hyperlipidemia: Secondary | ICD-10-CM | POA: Diagnosis not present

## 2022-12-06 DIAGNOSIS — R7301 Impaired fasting glucose: Secondary | ICD-10-CM | POA: Diagnosis not present

## 2022-12-06 DIAGNOSIS — K219 Gastro-esophageal reflux disease without esophagitis: Secondary | ICD-10-CM | POA: Diagnosis not present

## 2022-12-06 DIAGNOSIS — I69898 Other sequelae of other cerebrovascular disease: Secondary | ICD-10-CM

## 2022-12-06 DIAGNOSIS — T782XXD Anaphylactic shock, unspecified, subsequent encounter: Secondary | ICD-10-CM

## 2022-12-06 DIAGNOSIS — I10 Essential (primary) hypertension: Secondary | ICD-10-CM

## 2022-12-06 DIAGNOSIS — T782XXS Anaphylactic shock, unspecified, sequela: Secondary | ICD-10-CM

## 2022-12-06 DIAGNOSIS — G603 Idiopathic progressive neuropathy: Secondary | ICD-10-CM

## 2022-12-06 MED ORDER — DIPHENHYDRAMINE HCL 25 MG PO TABS: 25.0000 mg | ORAL_TABLET | Freq: Three times a day (TID) | ORAL | 0 refills | Status: DC | PRN

## 2022-12-06 MED ORDER — FAMOTIDINE 40 MG PO TABS: 40.0000 mg | ORAL_TABLET | Freq: Every day | ORAL | 0 refills | Status: DC

## 2022-12-06 NOTE — Progress Notes (Signed)
Subjective:  Patient ID: Jack Pierce, male    DOB: Mar 20, 1956  Age: 67 y.o. MRN: [id number]  Chief Complaint  Patient presents with   Hyperlipidemia   Hypertension   HPI:  Hyperlipidemia: Patient is currently taking Pravastatin 40 mg take 1 tablet daily, and Aspirin 81 mg daily.  Hypertension with history of stroke: Taking Chlorthalidone 25 mg daily, Metoprolol 25 mg take once daily, aspirin 81 mg daily, pravastatin 40 mg daily.   GERD: Taking Pantoprazole 40 mg 1 tablet twice daily.  Idiopathic Neuropathy: Taking Lyrica 25 mg THREE TIMES A DAY and Lamictal 100 mg before bed.   Pre-diabetes: Last HbA1c: 6.1  Patient  has had several episodes of angioedema. Most recently went to UC and was given epinephrine, kenalog, benadryl, and pepcid. Patient did not take pepcid. Unclear what angioedema is a reaction to.      12/06/2022   10:55 AM 11/28/2022    3:20 PM 06/15/2021    9:15 AM 03/13/2021    2:17 PM 06/27/2020   12:43 PM  Depression screen PHQ 2/9  Decreased Interest 0 0 0 0 0  Down, Depressed, Hopeless 0 0 0 0 0  PHQ - 2 Score 0 0 0 0 0         06/27/2020   12:43 PM 03/13/2021    2:17 PM 06/15/2021    9:15 AM 11/28/2022    3:21 PM 12/06/2022   10:55 AM  Fall Risk  Falls in the past year? 0 0 0 0 0  Was there an injury with Fall? 0 0 0 0 0  Fall Risk Category Calculator 0 0 0 0 0  Fall Risk Category (Retired) Low Low Low    (RETIRED) Patient Fall Risk Level  Low fall risk Low fall risk    Patient at Risk for Falls Due to No Fall Risks No Fall Risks  No Fall Risks No Fall Risks  Fall risk Follow up Falls prevention discussed Falls evaluation completed  Falls evaluation completed;Education provided Falls evaluation completed      Review of Systems  Constitutional:  Positive for fatigue. Negative for appetite change and fever.  HENT:  Positive for facial swelling (Allergic reaction has occured 4-5 times over the past 3 months.). Negative for congestion, ear pain, sinus  pressure and sore throat.   Respiratory:  Positive for shortness of breath (Comes and goes.). Negative for cough, chest tightness and wheezing.   Cardiovascular:  Negative for chest pain and palpitations.  Gastrointestinal:  Negative for abdominal pain, constipation, diarrhea, nausea and vomiting.  Genitourinary:  Negative for dysuria and hematuria.  Musculoskeletal:  Negative for arthralgias, back pain, joint swelling and myalgias.  Skin:  Negative for rash.  Neurological:  Positive for numbness (In feet mid calf downward with burning and pain.). Negative for dizziness, weakness and headaches.  Psychiatric/Behavioral:  Negative for dysphoric mood. The patient is not nervous/anxious.     Current Outpatient Medications on File Prior to Visit  Medication Sig Dispense Refill   aspirin 81 MG EC tablet Take 81 mg by mouth 2 (two) times daily.     chlorthalidone (HYGROTON) 25 MG tablet Take 1 tablet by mouth once daily 90 tablet 0   fluticasone (FLONASE) 50 MCG/ACT nasal spray Place 2 sprays into both nostrils daily. 16 g 6   ibuprofen (ADVIL) 600 MG tablet Take 1 tablet (600 mg total) by mouth 3 (three) times daily with meals. 90 tablet 3   lamoTRIgine (LAMICTAL) 100 MG tablet Take  1 tablet (100 mg total) by mouth daily. 90 tablet 2   metoprolol succinate (TOPROL-XL) 25 MG 24 hr tablet Take 1 tablet (25 mg total) by mouth daily. 90 tablet 0   pantoprazole (PROTONIX) 40 MG tablet Take 1 tablet (40 mg total) by mouth 2 (two) times daily. 180 tablet 1   pravastatin (PRAVACHOL) 40 MG tablet Take 1 tablet (40 mg total) by mouth at bedtime. 90 tablet 0   pregabalin (LYRICA) 25 MG capsule TAKE 1 CAPSULE BY MOUTH THREE TIMES DAILY 90 capsule 0   nitroGLYCERIN (NITROSTAT) 0.4 MG SL tablet SMARTSIG:1 Tablet(s) Sublingual PRN (Patient not taking: Reported on 12/06/2022)     No current facility-administered medications on file prior to visit.   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, mid forehead   Past Surgical History:  Procedure Laterality Date   [place]   1 removed    Family History  Problem Relation Age of Onset   Heart attack Mother    Coronary artery disease Father    CAD Brother    Suicidality Brother    CAD Sister    Social History   Socioeconomic History   Marital status: Married    Spouse name: Not on file   Number of children: 2   Years of education: Not on file   Highest education level: Not on file  Occupational History   Occupation: Retired  Tobacco Use   Smoking status: Never   Smokeless tobacco: Never  Vaping Use   Vaping Use: Never used  Substance and Sexual Activity   Alcohol use: No   Drug use: No   Sexual activity: Not on file  Other Topics Concern   Not on file  Social History Narrative   Not on file   Social Determinants of Health   Financial Resource Strain: Low Risk  (11/28/2022)   Overall Financial Resource Strain (CARDIA)    Difficulty of Paying Living Expenses: Not very hard  Food Insecurity: No Food Insecurity (11/28/2022)   Hunger Vital Sign    Worried About Running Out of Food in the Last Year: Never true    Ran Out of Food in the Last Year: Never true  Transportation Needs: No Transportation Needs (11/28/2022)   PRAPARE - Hydrologist (Medical): No    Lack of Transportation (Non-Medical): No  Physical Activity: Inactive (11/28/2022)   Exercise Vital Sign    Days of Exercise per Week: 0 days    Minutes of Exercise per Session: 0 min  Stress: No Stress Concern Present (11/28/2022)   [place]    Feeling of Stress : Not at all  Social Connections: [place] (11/28/2022)   Social Connection and Isolation Panel [NHANES]    Frequency of Communication with Friends and Family: More than three times a week    Frequency of Social Gatherings with Friends and Family: More than three times a week    Attends Religious Services: More than 4 times per year  Active Member of Clubs or Organizations: Yes    Attends Archivist Meetings: More than 4 times per year    Marital Status: Married    Objective:  BP 138/78 (BP Location: Left Arm, Patient Position: Sitting)   Pulse 78   Temp 97.8 F (36.6 C) (Temporal)   Ht 6' (1.829 m)   Wt 210 lb (95.3 kg)   SpO2 98%   BMI 28.48 kg/m      12/06/2022   10:46 AM 08/27/2022   11:20 AM 08/08/2022    2:52 PM  BP/Weight  Systolic BP 0000000 [id number]  Diastolic BP 78 72 78  Wt. (Lbs) 210  210.4  BMI 28.48 kg/m2  28.54 kg/m2    Physical Exam Vitals reviewed.  Constitutional:      Appearance: Normal appearance. He is normal weight.  Neck:     Vascular: No carotid bruit.  Cardiovascular:     Rate and Rhythm: Normal rate and regular rhythm.     Heart sounds: Normal heart sounds.  Pulmonary:     Effort: Pulmonary effort is normal.     Breath sounds: Normal breath sounds.  Abdominal:     General: Abdomen is flat. Bowel sounds are normal.     Palpations: Abdomen is soft.     Tenderness: There is no abdominal tenderness.  Neurological:     Mental Status: He is alert and oriented to person, place, and time.  Psychiatric:        Mood and Affect: Mood normal.        Behavior: Behavior normal.     Diabetic Foot Exam - Simple   No data filed      Lab Results  Component Value Date   WBC 6.7 12/06/2022   HGB 13.3 12/06/2022   HCT 39.3 12/06/2022   PLT 206 12/06/2022   GLUCOSE 105 (H) 12/06/2022   CHOL 152 12/06/2022   TRIG 79 12/06/2022   HDL 65  12/06/2022   LDLCALC 72 12/06/2022   ALT 17 12/06/2022   AST 20 12/06/2022   NA 139 12/06/2022   K 4.3 12/06/2022   CL 96 12/06/2022   CREATININE 1.43 (H) 12/06/2022   BUN 14 12/06/2022   CO2 27 12/06/2022   TSH 2.850 02/06/2021   HGBA1C 6.3 (H) 12/06/2022      Assessment & Plan:    Essential hypertension, benign Assessment & Plan: Well controlled.  No changes to medicines. Continue Chlorthalidone 25 mg daily, Metoprolol 25 mg take once daily Continue to work on eating a healthy diet and exercise.  Labs drawn today.    Orders: -     CBC With Diff/Platelet -     Comprehensive metabolic panel  Mixed hyperlipidemia Assessment & Plan: Well controlled.  No changes to medicines. Continue pravastatin 40 mg daily.  Continue to work on eating a healthy diet and exercise.  Check labs.  Orders: -     CBC With Diff/Platelet -     Lipid panel -     Cardiovascular Risk Assessment  Gastro-esophageal reflux disease without esophagitis Assessment & Plan: The current medical regimen is effective;  continue present plan and medications. Continue pantoprazole 40 mg twice daily.    Impaired fasting glucose -     Hemoglobin A1c  Idiopathic progressive neuropathy Assessment & Plan: Continue lyrica 25 mg three times a day. May decrease lamictal to 100 mg 1/2 daily, then discontinue it. If neuropathy worsens, increase lamictal back up.    Anaphylaxis, sequela Assessment &  Plan: Refer to allergist.  Keep benadryl and epi pen at all times.  Discussed use of pepcid (histamine blocker.)   Orders: -     Ambulatory referral to Allergy -     diphenhydrAMINE HCl; Take 1 tablet (25 mg total) by mouth every 8 (eight) hours as needed for itching or allergies.  Dispense: 90 tablet; Refill: 0 -     Famotidine; Take 1 tablet (40 mg total) by mouth daily.  Dispense: 90 tablet; Refill: 0  Other sequelae of other cerebrovascular disease Assessment & Plan: Patient has memory problems  related to the stroke he had.  Recommended to continue aspirin 81 mg once daily and pravastatin.  His memory is stable.      Meds ordered this encounter  Medications   diphenhydrAMINE (BENADRYL ALLERGY) 25 MG tablet    Sig: Take 1 tablet (25 mg total) by mouth every 8 (eight) hours as needed for itching or allergies.    Dispense:  90 tablet    Refill:  0   famotidine (PEPCID) 40 MG tablet    Sig: Take 1 tablet (40 mg total) by mouth daily.    Dispense:  90 tablet    Refill:  0    Orders Placed This Encounter  Procedures   CBC With Diff/Platelet   Comprehensive metabolic panel   Lipid panel   Hemoglobin A1c   Cardiovascular Risk Assessment   Ambulatory referral to Allergy     Follow-up: No follow-ups on file.   I,Lauren M Auman,acting as a scribe for Rochel Brome, MD.,have documented all relevant documentation on the behalf of Rochel Brome, MD,as directed by  Rochel Brome, MD while in the presence of Rochel Brome, MD.   An After Visit Summary was printed and given to the patient.  I attest that I have reviewed this visit and agree with the plan scribed by my staff.   Rochel Brome, MD [facility] [phone]

## 2022-12-06 NOTE — Patient Instructions (Signed)
Decrease Lamictal

## 2022-12-07 LAB — COMPREHENSIVE METABOLIC PANEL
ALT: 17 IU/L (ref 0–44)
AST: 20 IU/L (ref 0–40)
Albumin/Globulin Ratio: 1.6 (ref 1.2–2.2)
Albumin: 4.4 g/dL (ref 3.9–4.9)
Alkaline Phosphatase: 77 IU/L (ref 44–121)
BUN/Creatinine Ratio: 10 (ref 10–24)
BUN: 14 mg/dL (ref 8–27)
Bilirubin Total: 0.8 mg/dL (ref 0.0–1.2)
CO2: 27 mmol/L (ref 20–29)
Calcium: 9.5 mg/dL (ref 8.6–10.2)
Chloride: 96 mmol/L (ref 96–106)
Creatinine, Ser: 1.43 mg/dL — ABNORMAL HIGH (ref 0.76–1.27)
Globulin, Total: 2.7 g/dL (ref 1.5–4.5)
Glucose: 105 mg/dL — ABNORMAL HIGH (ref 70–99)
Potassium: 4.3 mmol/L (ref 3.5–5.2)
Sodium: 139 mmol/L (ref 134–144)
Total Protein: 7.1 g/dL (ref 6.0–8.5)
eGFR: 54 mL/min/{1.73_m2} — ABNORMAL LOW (ref >59)

## 2022-12-07 LAB — CBC WITH DIFF/PLATELET
Basophils Absolute: 0 10*3/uL (ref 0.0–0.2)
Basos: 1 %
EOS (ABSOLUTE): 0.2 10*3/uL (ref 0.0–0.4)
Eos: 3 %
Hematocrit: 39.3 % (ref 37.5–51.0)
Hemoglobin: 13.3 g/dL (ref 13.0–17.7)
Immature Grans (Abs): 0 10*3/uL (ref 0.0–0.1)
Immature Granulocytes: 0 %
Lymphocytes Absolute: 1.4 10*3/uL (ref 0.7–3.1)
Lymphs: 20 %
MCH: 27.4 pg (ref 26.6–33.0)
MCHC: 33.8 g/dL (ref 31.5–35.7)
MCV: 81 fL (ref 79–97)
Monocytes Absolute: 0.8 10*3/uL (ref 0.1–0.9)
Monocytes: 13 %
Neutrophils Absolute: 4.3 10*3/uL (ref 1.4–7.0)
Neutrophils: 63 %
Platelets: 206 10*3/uL (ref 150–450)
RBC: 4.85 x10E6/uL (ref 4.14–5.80)
RDW: 12.4 % (ref 11.6–15.4)
WBC: 6.7 10*3/uL (ref 3.4–10.8)

## 2022-12-07 LAB — HEMOGLOBIN A1C
Est. average glucose Bld gHb Est-mCnc: 134 mg/dL
Hgb A1c MFr Bld: 6.3 % — ABNORMAL HIGH (ref 4.8–5.6)

## 2022-12-07 LAB — LIPID PANEL
Chol/HDL Ratio: 2.3 ratio (ref 0.0–5.0)
Cholesterol, Total: 152 mg/dL (ref 100–199)
HDL: 65 mg/dL (ref >39)
LDL Chol Calc (NIH): 72 mg/dL (ref 0–99)
Triglycerides: 79 mg/dL (ref 0–149)
VLDL Cholesterol Cal: 15 mg/dL (ref 5–40)

## 2022-12-07 LAB — CARDIOVASCULAR RISK ASSESSMENT

## 2022-12-09 ENCOUNTER — Encounter: Payer: Self-pay | Admitting: Family Medicine

## 2022-12-09 NOTE — Assessment & Plan Note (Signed)
Continue lyrica 25 mg three times a day. May decrease lamictal to 100 mg 1/2 daily, then discontinue it. If neuropathy worsens, increase lamictal back up.

## 2022-12-09 NOTE — Assessment & Plan Note (Signed)
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  Recommended to continue aspirin 81 mg once daily and pravastatin.  His memory is stable.

## 2022-12-09 NOTE — Progress Notes (Signed)
Blood count normal.  Liver function normal.  Kidney function abnormal but stable.  Thyroid function normal.  Cholesterol: Very good HBA1C: 6.3.  Worsening.Diabetic after above 6.5.  Decrease sugar in diet.

## 2022-12-09 NOTE — Assessment & Plan Note (Signed)
Well controlled.  No changes to medicines. Continue Chlorthalidone 25 mg daily, Metoprolol 25 mg take once daily Continue to work on eating a healthy diet and exercise.  Labs drawn today.

## 2022-12-09 NOTE — Assessment & Plan Note (Signed)
Refer to allergist.  Keep benadryl and epi pen at all times.  Discussed use of pepcid (histamine blocker.)

## 2022-12-09 NOTE — Assessment & Plan Note (Addendum)
Well controlled.  No changes to medicines. Continue pravastatin 40 mg daily.  Continue to work on eating a healthy diet and exercise.  Check labs.

## 2022-12-09 NOTE — Assessment & Plan Note (Signed)
The current medical regimen is effective;  continue present plan and medications. Continue pantoprazole 40 mg twice daily.  

## 2023-01-30 ENCOUNTER — Ambulatory Visit: Payer: Medicare Other | Admitting: Allergy and Immunology

## 2023-01-30 ENCOUNTER — Other Ambulatory Visit: Payer: Self-pay | Admitting: Family Medicine

## 2023-01-30 DIAGNOSIS — G603 Idiopathic progressive neuropathy: Secondary | ICD-10-CM

## 2023-02-15 ENCOUNTER — Other Ambulatory Visit: Payer: Self-pay | Admitting: Family Medicine

## 2023-02-15 DIAGNOSIS — I10 Essential (primary) hypertension: Secondary | ICD-10-CM

## 2023-05-01 ENCOUNTER — Other Ambulatory Visit: Payer: Self-pay | Admitting: Physician Assistant

## 2023-05-01 ENCOUNTER — Other Ambulatory Visit: Payer: Self-pay | Admitting: Family Medicine

## 2023-05-01 DIAGNOSIS — I10 Essential (primary) hypertension: Secondary | ICD-10-CM

## 2023-05-14 ENCOUNTER — Other Ambulatory Visit: Payer: Self-pay | Admitting: Family Medicine

## 2023-05-14 DIAGNOSIS — I10 Essential (primary) hypertension: Secondary | ICD-10-CM

## 2023-06-11 ENCOUNTER — Ambulatory Visit: Payer: Medicare Other | Admitting: Allergy

## 2023-07-09 ENCOUNTER — Other Ambulatory Visit: Payer: Self-pay | Admitting: Family Medicine

## 2023-07-10 ENCOUNTER — Other Ambulatory Visit: Payer: Self-pay

## 2023-07-10 DIAGNOSIS — G603 Idiopathic progressive neuropathy: Secondary | ICD-10-CM

## 2023-07-10 MED ORDER — PRAVASTATIN SODIUM 40 MG PO TABS: 40.0000 mg | ORAL_TABLET | Freq: Every day | ORAL | 0 refills | Status: DC

## 2023-07-11 ENCOUNTER — Other Ambulatory Visit: Payer: Self-pay

## 2023-07-11 ENCOUNTER — Telehealth: Payer: Self-pay | Admitting: Family Medicine

## 2023-07-11 DIAGNOSIS — I10 Essential (primary) hypertension: Secondary | ICD-10-CM

## 2023-07-11 MED ORDER — PRAVASTATIN SODIUM 40 MG PO TABS: 40.0000 mg | ORAL_TABLET | Freq: Every day | ORAL | 0 refills | Status: DC

## 2023-07-11 MED ORDER — CHLORTHALIDONE 25 MG PO TABS: 25.0000 mg | ORAL_TABLET | Freq: Every day | ORAL | 0 refills | Status: DC

## 2023-07-11 MED ORDER — PREGABALIN 25 MG PO CAPS
25.0000 mg | ORAL_CAPSULE | Freq: Three times a day (TID) | ORAL | 5 refills | Status: DC
Start: 2023-07-11 — End: 2024-01-30

## 2023-07-11 NOTE — Telephone Encounter (Signed)
Prescription Request  07/11/2023  LOV: 12/06/2022  What is the name of the medication or equipment?  chlorthalidone (HYGROTON) 25 MG tablet  chlorthalidone (HYGROTON) 25 MG tablet  Which pharmacy would you like this sent to?  [facility] [address], [place] - [address] [place] [address] Phone: [phone] Fax: [phone]    Patient notified that their request is being sent to the clinical staff for review and that they should receive a response within 2 business days.   Please advise at Mobile [phone] (mobile)

## 2023-07-16 ENCOUNTER — Other Ambulatory Visit: Payer: Self-pay

## 2023-07-16 DIAGNOSIS — M722 Plantar fascial fibromatosis: Secondary | ICD-10-CM

## 2023-07-17 MED ORDER — IBUPROFEN 600 MG PO TABS: 600.0000 mg | ORAL_TABLET | Freq: Three times a day (TID) | ORAL | 3 refills | Status: AC

## 2023-07-25 ENCOUNTER — Ambulatory Visit: Payer: Medicare Other | Admitting: Family Medicine

## 2023-07-25 ENCOUNTER — Encounter: Payer: Self-pay | Admitting: Family Medicine

## 2023-07-25 VITALS — BP 132/86 | HR 53 | Temp 97.6°F | Ht 73.8 in | Wt 211.0 lb

## 2023-07-25 DIAGNOSIS — R7303 Prediabetes: Secondary | ICD-10-CM

## 2023-07-25 DIAGNOSIS — K219 Gastro-esophageal reflux disease without esophagitis: Secondary | ICD-10-CM

## 2023-07-25 DIAGNOSIS — I10 Essential (primary) hypertension: Secondary | ICD-10-CM

## 2023-07-25 DIAGNOSIS — H6121 Impacted cerumen, right ear: Secondary | ICD-10-CM

## 2023-07-25 DIAGNOSIS — G603 Idiopathic progressive neuropathy: Secondary | ICD-10-CM

## 2023-07-25 DIAGNOSIS — Z23 Encounter for immunization: Secondary | ICD-10-CM

## 2023-07-25 DIAGNOSIS — E782 Mixed hyperlipidemia: Secondary | ICD-10-CM

## 2023-07-25 MED ORDER — FAMOTIDINE 40 MG PO TABS
40.0000 mg | ORAL_TABLET | Freq: Every day | ORAL | 0 refills | Status: DC
Start: 2023-07-25 — End: 2024-01-30

## 2023-07-25 MED ORDER — METOPROLOL SUCCINATE ER 25 MG PO TB24
25.0000 mg | ORAL_TABLET | Freq: Every day | ORAL | 0 refills | Status: DC
Start: 2023-07-25 — End: 2024-03-26

## 2023-07-25 MED ORDER — PANTOPRAZOLE SODIUM 40 MG PO TBEC
40.0000 mg | DELAYED_RELEASE_TABLET | Freq: Two times a day (BID) | ORAL | 1 refills | Status: DC
Start: 2023-07-25 — End: 2024-05-25

## 2023-07-25 MED ORDER — LAMOTRIGINE 100 MG PO TABS: 100.0000 mg | ORAL_TABLET | Freq: Every day | ORAL | 2 refills | Status: DC

## 2023-07-25 MED ORDER — CHLORTHALIDONE 25 MG PO TABS
25.0000 mg | ORAL_TABLET | Freq: Every day | ORAL | 0 refills | Status: DC
Start: 2023-07-25 — End: 2023-09-22

## 2023-07-25 MED ORDER — PRAVASTATIN SODIUM 40 MG PO TABS
40.0000 mg | ORAL_TABLET | Freq: Every day | ORAL | 0 refills | Status: DC
Start: 2023-07-25 — End: 2023-10-17

## 2023-07-25 NOTE — Assessment & Plan Note (Signed)
Acute Right ear lavage/irrigation with cerumen removal Patient tolerated well Encouraged to use Debrox OTC ear drops to help loosen wax

## 2023-07-25 NOTE — Assessment & Plan Note (Addendum)
Well controlled  No changes to medications.  Continue current medication Chlorthalidone 25 mg daily, Metoprolol 12.5 mg take once daily and Aspirin 81 mg daily.   Recommend continue working on diet and exercise Labs drawn today

## 2023-07-25 NOTE — Assessment & Plan Note (Signed)
Improving Continue lyrica 25 mg three times a day and Lamictal 100 mg before bed.

## 2023-07-25 NOTE — Assessment & Plan Note (Signed)
The current medical regimen is effective.  Continue Pantoprazole 40 mg TWICE A DAY

## 2023-07-25 NOTE — Addendum Note (Signed)
Addended by: Renne Crigler on: 07/25/2023 02:44 PM   Modules accepted: Level of Service

## 2023-07-25 NOTE — Assessment & Plan Note (Signed)
Well controlled No changes to medications Continue pravastatin 40 mg daily Recommend continue working on diet and exercise Labs drawn today

## 2023-07-25 NOTE — Assessment & Plan Note (Signed)
Stable Recommend continue working on carb modified diet and increase exercise as tolerated

## 2023-07-25 NOTE — Progress Notes (Signed)
Subjective:  Patient ID: Jack Pierce, male    DOB: 08/24/1956  Age: 67 y.o. MRN: [id number]  Chief Complaint  Patient presents with   Medical Management of Chronic Issues    HPI   Hyperlipidemia: Management with Pravastatin 40 mg @ bedtime He reports compliance with treatment and no side effects.  Hypertension with history of stroke:  Last BP 138/78 Management with Chlorthalidone 25 mg daily, Metoprolol 12.5 mg take once daily and Aspirin 81 mg daily.  Reports compliance with treatment and no side effects. Denies shortness of breath and chest pain.    GERD:  Management with Pantoprazole 40 mg 1 tablet twice daily.  Reports compliance with no side effects.   Idiopathic Neuropathy:  Taking Lyrica 25 mg THREE TIMES A DAY and Lamictal 100 mg before bed.  Reports that he is still having numbness, but symptoms are much improved   Pre-diabetes:  Last HbA1c: 6.3 Patient reports that he will eat anything he prays over, but he needs to cut back on the sweets.  Patient has maintained his weight.      07/25/2023   10:15 AM 12/06/2022   10:55 AM 11/28/2022    3:20 PM 06/15/2021    9:15 AM 03/13/2021    2:17 PM  Depression screen PHQ 2/9  Decreased Interest 1 0 0 0 0  Down, Depressed, Hopeless 0 0 0 0 0  PHQ - 2 Score 1 0 0 0 0  Altered sleeping 1      Tired, decreased energy 2      Change in appetite 0      Feeling bad or failure about yourself  0      Trouble concentrating 1      Moving slowly or fidgety/restless 0      Suicidal thoughts 0      PHQ-9 Score 5      Difficult doing work/chores Not difficult at all            07/25/2023   10:15 AM  Fall Risk   Falls in the past year? 0  Number falls in past yr: 0  Injury with Fall? 0  Risk for fall due to : No Fall Risks  Follow up Falls evaluation completed    Patient Care Team: Renne Crigler, FNP as PCP - General (Family Medicine)   Review of Systems  Constitutional:  Negative for chills, diaphoresis, fatigue  and fever.  HENT:  Negative for congestion, sinus pressure, sinus pain and sore throat. Ear pain: slight.  Respiratory:  Negative for cough and shortness of breath.   Cardiovascular:  Negative for chest pain and leg swelling.  Gastrointestinal:  Negative for abdominal pain, constipation, diarrhea, nausea and vomiting.  Genitourinary:  Negative for dysuria and urgency.  Musculoskeletal:  Positive for back pain. Negative for arthralgias and myalgias.  Neurological:  Positive for numbness (legs and feet). Negative for dizziness and headaches.  Psychiatric/Behavioral:  Negative for dysphoric mood.     Current Outpatient Medications on File Prior to Visit  Medication Sig Dispense Refill   aspirin 81 MG EC tablet Take 81 mg by mouth 2 (two) times daily.     fluticasone (FLONASE) 50 MCG/ACT nasal spray Place 2 sprays into both nostrils daily. 16 g 6   ibuprofen (ADVIL) 600 MG tablet Take 1 tablet (600 mg total) by mouth 3 (three) times daily with meals. 90 tablet 3   nitroGLYCERIN (NITROSTAT) 0.4 MG SL tablet SMARTSIG:1 Tablet(s) Sublingual PRN (Patient not taking:  Reported on 12/06/2022)     pregabalin (LYRICA) 25 MG capsule Take 1 capsule (25 mg total) by mouth 3 (three) times daily. 90 capsule 5   No current facility-administered medications on file prior to visit.   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, mid forehead   Past Surgical History:  Procedure Laterality Date   HERNIA REPAIR  1964 & 1967   TESTICLE REMOVAL  1981   1 removed    Family History  Problem Relation Age of Onset   Heart attack Mother    Coronary  artery disease Father    CAD Brother    Suicidality Brother    CAD Sister    Social History   Socioeconomic History   Marital status: Married    Spouse name: Not on file   Number of children: 2   Years of education: Not on file   Highest education level: Not on file  Occupational History   Occupation: Retired  Tobacco Use   Smoking status: Never   Smokeless tobacco: Never  Vaping Use   Vaping status: Never Used  Substance and Sexual Activity   Alcohol use: No   Drug use: No   Sexual activity: Not on file  Other Topics Concern   Not on file  Social History Narrative   Not on file   Social Determinants of Health   Financial Resource Strain: Low Risk  (11/28/2022)   Overall Financial Resource Strain (CARDIA)    Difficulty of Paying Living Expenses: Not very hard  Food Insecurity: No Food Insecurity (11/28/2022)   Hunger Vital Sign    Worried About Running Out of Food in the Last Year: Never true    Ran Out of Food in the Last Year: Never true  Transportation Needs: No Transportation Needs (11/28/2022)   PRAPARE - Administrator, Civil Service (Medical): No    Lack of Transportation (Non-Medical): No  Physical Activity: Inactive (11/28/2022)   Exercise Vital Sign    Days of Exercise per Week: 0 days    Minutes of Exercise per Session: 0 min  Stress: No Stress Concern Present (11/28/2022)   [employer] of Occupational Health - Occupational Stress Questionnaire    Feeling of Stress : Not at all  Social Connections: Socially Integrated (11/28/2022)   Social Connection and Isolation Panel [NHANES]    Frequency of Communication with Friends and Family: More than three times a week    Frequency of Social Gatherings with Friends and Family: More than three times a week    Attends Religious Services: More than 4 times per year    Active Member of [employer] or Organizations: Yes    Attends Engineer, structural: More than 4 times per year    Marital Status: Married     Objective:  BP 132/86   Pulse (!) 53   Temp 97.6 F (36.4 C)   Ht 6' 1.8" (1.875 m)   Wt 211 lb (95.7 kg)   SpO2 98%   BMI 27.24 kg/m      07/25/2023   10:08 AM 12/06/2022   10:46  AM 08/27/2022   11:20 AM  BP/Weight  Systolic BP 132 138 122  Diastolic BP 86 78 72  Wt. (Lbs) 211 210   BMI 27.24 kg/m2 28.48 kg/m2     Physical Exam Vitals reviewed.  Constitutional:      General: He is not in acute distress.    Appearance: Normal appearance. He is not ill-appearing.  HENT:     Right Ear: Tympanic membrane normal. Tenderness present. There is impacted cerumen (partial).     Left Ear: Tympanic membrane normal.     Nose: Nose normal. No congestion.     Mouth/Throat:     Mouth: Mucous membranes are moist.     Pharynx: No posterior oropharyngeal erythema.  Eyes:     Extraocular Movements: Extraocular movements intact.     Conjunctiva/sclera: Conjunctivae normal.     Pupils: Pupils are equal, round, and reactive to light.  Cardiovascular:     Rate and Rhythm: Normal rate and regular rhythm.     Heart sounds: Normal heart sounds.  Pulmonary:     Effort: Pulmonary effort is normal.     Breath sounds: Normal breath sounds.  Abdominal:     General: Bowel sounds are normal.     Palpations: Abdomen is soft.     Tenderness: There is no abdominal tenderness.  Musculoskeletal:     Cervical back: Normal range of motion and neck supple.  Skin:    General: Skin is warm.  Neurological:     Mental Status: He is alert.     Cranial Nerves: Cranial nerves 2-12 are intact.     Deep Tendon Reflexes: Reflexes are normal and symmetric.  Psychiatric:        Mood and Affect: Mood normal.     Diabetic Foot Exam - Simple   Simple Foot Form Visual Inspection No deformities, no ulcerations, no other skin breakdown bilaterally: Yes Sensation Testing Intact to touch and monofilament testing bilaterally: Yes Pulse Check Posterior Tibialis and Dorsalis pulse intact bilaterally:  Yes Comments      Lab Results  Component Value Date   WBC 6.7 12/06/2022   HGB 13.3 12/06/2022   HCT 39.3 12/06/2022   PLT 206 12/06/2022   GLUCOSE 105 (H) 12/06/2022   CHOL 152 12/06/2022   TRIG 79 12/06/2022   HDL 65 12/06/2022   LDLCALC 72 12/06/2022   ALT 17 12/06/2022   AST 20 12/06/2022   NA 139 12/06/2022   K 4.3 12/06/2022   CL 96 12/06/2022   CREATININE 1.43 (H) 12/06/2022   BUN 14 12/06/2022   CO2 27 12/06/2022   TSH 2.850 02/06/2021   HGBA1C 6.3 (H) 12/06/2022      Assessment & Plan:    Essential hypertension, benign Assessment & Plan: Well controlled  No changes to medications.  Continue current medication Chlorthalidone 25 mg daily, Metoprolol 12.5 mg take once daily and Aspirin 81 mg daily.   Recommend continue working on diet and exercise Labs drawn today  Orders: -     Chlorthalidone; Take 1 tablet (25 mg total) by mouth daily.  Dispense: 30 tablet; Refill: 0 -     Metoprolol Succinate ER; Take 1 tablet (25 mg total) by mouth daily.  Dispense: 90 tablet; Refill: 0 -     CBC with Differential/Platelet -     Comprehensive metabolic panel  Mixed hyperlipidemia Assessment & Plan: Well controlled No changes to medications Continue pravastatin 40 mg daily Recommend continue working on diet and exercise Labs drawn today  Orders: -     Pravastatin Sodium; Take 1 tablet (40 mg total) by mouth at bedtime.  Dispense: 90 tablet; Refill: 0 -     Lipid panel  Idiopathic progressive neuropathy Assessment & Plan: Improving Continue lyrica 25 mg three times a day and Lamictal 100 mg before bed.    Prediabetes Assessment & Plan: Stable Recommend continue working on carb modified diet and increase exercise as tolerated   Orders: -     Hemoglobin A1c  Gastro-esophageal reflux disease without esophagitis Assessment & Plan: The current medical regimen is effective.  Continue Pantoprazole 40 mg TWICE A DAY   Orders: -     Famotidine; Take 1  tablet (40 mg total) by mouth daily.  Dispense: 90 tablet; Refill: 0 -     Pantoprazole Sodium; Take 1 tablet (40 mg total) by mouth 2 (two) times daily.  Dispense: 180 tablet; Refill: 1  Impacted cerumen, right ear Assessment & Plan: Acute Right ear lavage/irrigation with cerumen removal Patient tolerated well Encouraged to use Debrox OTC ear drops to help loosen wax   Immunization due -     Flu Vaccine Trivalent High Dose (Fluad)  Other orders -     lamoTRIgine; Take 1 tablet (100 mg total) by mouth daily.  Dispense: 90 tablet; Refill: 2     Meds ordered this encounter  Medications   chlorthalidone (HYGROTON) 25 MG tablet    Sig: Take 1 tablet (25 mg total) by mouth daily.    Dispense:  30 tablet    Refill:  0   pravastatin (PRAVACHOL) 40 MG tablet    Sig: Take 1 tablet (40 mg total) by mouth at bedtime.    Dispense:  90 tablet    Refill:  0   famotidine (PEPCID) 40 MG tablet    Sig: Take 1 tablet (40 mg total) by mouth daily.    Dispense:  90 tablet    Refill:  0   DISCONTD: lamoTRIgine (LAMICTAL) 100 MG tablet    Sig: Take 1 tablet (100 mg total) by mouth daily.    Dispense:  90 tablet    Refill:  2   metoprolol succinate (TOPROL-XL) 25 MG 24 hr tablet    Sig: Take 1 tablet (25 mg total) by mouth daily.    Dispense:  90 tablet    Refill:  0   pantoprazole (PROTONIX) 40 MG tablet    Sig: Take 1 tablet (40 mg total) by mouth 2 (two) times daily.    Dispense:  180 tablet    Refill:  1   lamoTRIgine (LAMICTAL) 100 MG tablet    Sig: Take 1 tablet (100 mg total) by mouth daily.    Dispense:  90 tablet    Refill:  2    Orders Placed This Encounter  Procedures   Flu Vaccine Trivalent High Dose (Fluad)   CBC with Differential/Platelet   Comprehensive metabolic panel   Lipid panel   Hemoglobin A1c     Follow-up: Return in about 6 months (around 01/23/2024) for chronic.   I,Katherina A Bramblett,acting as a scribe for Renne Crigler, FNP.,have documented all  relevant documentation on the behalf of Renne Crigler, FNP,as directed by  Renne Crigler, FNP while in the presence of Renne Crigler, FNP.   An After Visit Summary was printed and given to the patient.  Total time spent on today's visit was greater than 30 minutes, including both face-to-face time and nonface-to-face time personally spent on review  of chart (labs and imaging), discussing labs and goals, discussing further work-up, treatment options, referrals to specialist if needed, reviewing outside records of pertinent, answering patient's questions, and coordinating care.   Lajuana Matte, FNP [facility] [phone]

## 2023-07-26 LAB — CBC WITH DIFFERENTIAL/PLATELET
Basophils Absolute: 0.1 10*3/uL (ref 0.0–0.2)
Basos: 1 %
EOS (ABSOLUTE): 0.2 10*3/uL (ref 0.0–0.4)
Eos: 3 %
Hematocrit: 39 % (ref 37.5–51.0)
Hemoglobin: 13.1 g/dL (ref 13.0–17.7)
Immature Grans (Abs): 0 10*3/uL (ref 0.0–0.1)
Immature Granulocytes: 0 %
Lymphocytes Absolute: 1.4 10*3/uL (ref 0.7–3.1)
Lymphs: 24 %
MCH: 27.9 pg (ref 26.6–33.0)
MCHC: 33.6 g/dL (ref 31.5–35.7)
MCV: 83 fL (ref 79–97)
Monocytes Absolute: 0.7 10*3/uL (ref 0.1–0.9)
Monocytes: 12 %
Neutrophils Absolute: 3.4 10*3/uL (ref 1.4–7.0)
Neutrophils: 60 %
Platelets: 196 10*3/uL (ref 150–450)
RBC: 4.7 x10E6/uL (ref 4.14–5.80)
RDW: 12.8 % (ref 11.6–15.4)
WBC: 5.7 10*3/uL (ref 3.4–10.8)

## 2023-07-26 LAB — LIPID PANEL
Chol/HDL Ratio: 2.6 {ratio} (ref 0.0–5.0)
Cholesterol, Total: 143 mg/dL (ref 100–199)
HDL: 55 mg/dL (ref >39)
LDL Chol Calc (NIH): 73 mg/dL (ref 0–99)
Triglycerides: 78 mg/dL (ref 0–149)
VLDL Cholesterol Cal: 15 mg/dL (ref 5–40)

## 2023-07-26 LAB — COMPREHENSIVE METABOLIC PANEL
ALT: 17 [IU]/L (ref 0–44)
AST: 23 [IU]/L (ref 0–40)
Albumin: 4.8 g/dL (ref 3.9–4.9)
Alkaline Phosphatase: 92 [IU]/L (ref 44–121)
BUN/Creatinine Ratio: 12 (ref 10–24)
BUN: 16 mg/dL (ref 8–27)
Bilirubin Total: 1 mg/dL (ref 0.0–1.2)
CO2: 27 mmol/L (ref 20–29)
Calcium: 10.1 mg/dL (ref 8.6–10.2)
Chloride: 93 mmol/L — ABNORMAL LOW (ref 96–106)
Creatinine, Ser: 1.35 mg/dL — ABNORMAL HIGH (ref 0.76–1.27)
Globulin, Total: 2.8 g/dL (ref 1.5–4.5)
Glucose: 104 mg/dL — ABNORMAL HIGH (ref 70–99)
Potassium: 4.3 mmol/L (ref 3.5–5.2)
Sodium: 136 mmol/L (ref 134–144)
Total Protein: 7.6 g/dL (ref 6.0–8.5)
eGFR: 58 mL/min/{1.73_m2} — ABNORMAL LOW (ref >59)

## 2023-07-26 LAB — HEMOGLOBIN A1C
Est. average glucose Bld gHb Est-mCnc: 128 mg/dL
Hgb A1c MFr Bld: 6.1 % — ABNORMAL HIGH (ref 4.8–5.6)

## 2023-07-30 ENCOUNTER — Ambulatory Visit: Payer: Medicare Other | Admitting: Family Medicine

## 2023-09-21 ENCOUNTER — Other Ambulatory Visit: Payer: Self-pay | Admitting: Family Medicine

## 2023-09-21 DIAGNOSIS — I10 Essential (primary) hypertension: Secondary | ICD-10-CM

## 2023-09-25 ENCOUNTER — Encounter: Payer: Self-pay | Admitting: Family Medicine

## 2023-09-25 ENCOUNTER — Ambulatory Visit: Payer: Medicare Other | Admitting: Family Medicine

## 2023-09-25 VITALS — BP 112/70 | HR 62 | Temp 98.0°F | Ht 72.0 in | Wt 218.2 lb

## 2023-09-25 DIAGNOSIS — J029 Acute pharyngitis, unspecified: Secondary | ICD-10-CM

## 2023-09-25 DIAGNOSIS — J0110 Acute frontal sinusitis, unspecified: Secondary | ICD-10-CM

## 2023-09-25 DIAGNOSIS — R051 Acute cough: Secondary | ICD-10-CM | POA: Diagnosis not present

## 2023-09-25 LAB — POCT RAPID STREP A (OFFICE): Rapid Strep A Screen: NEGATIVE

## 2023-09-25 LAB — POC COVID19 BINAXNOW: SARS Coronavirus 2 Ag: NEGATIVE

## 2023-09-25 MED ORDER — CIPROFLOXACIN HCL 500 MG PO TABS
500.0000 mg | ORAL_TABLET | Freq: Two times a day (BID) | ORAL | 0 refills | Status: AC
Start: 2023-09-27 — End: 2023-10-07

## 2023-09-25 NOTE — Assessment & Plan Note (Signed)
Acute -Patient to try cough drops, hot tea, or honey and lemon -please call if you have trouble breathing or respiratory distress

## 2023-09-25 NOTE — Assessment & Plan Note (Addendum)
Strep test - negative -Gargle with warm salt water -Honey and lemon with warm tea

## 2023-09-25 NOTE — Progress Notes (Addendum)
Acute Office Visit  Subjective:     Patient ID: Jack Pierce, male    DOB: [birth year], 67 y.o.   MRN: [id number]  Chief Complaint  Patient presents with   Cough   Sinusitis    HPI The patient, with a past medical history of stroke and prediabetes, presents with symptoms suggestive of a sinus infection. The symptoms started four days ago, on Sunday afternoon, and have not improved since. The patient reports a cough, sore throat, ear pain in both ears, and sinus pressure behind the eyes. The patient also mentions feeling as if their throat is closing in, and has been coughing up green or yellow phlegm for the past two mornings. The patient has tried over-the-counter medication (Mucinex DM), but stopped due to experiencing side effects such as feeling foggy and as if they were "in orbit." The patient also mentions sensitivity to certain medications, including Benadryl, which they have taken in the past. The patient has a history of sensitivity to doxy, meloxicam, amlodipine, cefdinir, and Cozaar. The patient also reports numbness in their feet, which they attribute to neuropathy from prediabetes.  Review of Systems  Constitutional:  Negative for chills, diaphoresis, fever and malaise/fatigue.  HENT:  Positive for ear pain (both), sinus pain (behind the eyes) and sore throat.   Eyes:  Negative for pain.  Respiratory:  Positive for cough. Negative for shortness of breath.   Cardiovascular:  Negative for chest pain.       Chest tightness  Gastrointestinal:  Positive for nausea. Negative for constipation, diarrhea and vomiting.  Genitourinary:  Negative for dysuria, frequency and urgency.  Musculoskeletal:  Negative for joint pain and myalgias.  Skin:  Negative for rash.  Neurological:  Positive for headaches.      Objective:    BP 112/70   Pulse 62   Temp 98 F (36.7 C)   Ht 6' (1.829 m)   Wt 218 lb 3.2 oz (99 kg)   SpO2 98%   BMI 29.59 kg/m    Physical  Exam Constitutional:      General: He is not in acute distress.    Appearance: Normal appearance.  HENT:     Head: Normocephalic.     Right Ear: Tympanic membrane normal. Tympanic membrane is not erythematous.     Left Ear: Decreased hearing noted. A middle ear effusion is present.     Mouth/Throat:     Mouth: Mucous membranes are moist.     Pharynx: Posterior oropharyngeal erythema present.  Cardiovascular:     Rate and Rhythm: Normal rate and regular rhythm.     Heart sounds: Normal heart sounds.  Pulmonary:     Effort: Pulmonary effort is normal.     Breath sounds: Normal breath sounds.  Abdominal:     General: Bowel sounds are normal.     Palpations: Abdomen is soft.  Skin:    General: Skin is warm.  Neurological:     Mental Status: He is alert. Mental status is at baseline.  Psychiatric:        Mood and Affect: Mood normal.        Behavior: Behavior normal.     Results for orders placed or performed in visit on 09/25/23  POC COVID-19  Result Value Ref Range   SARS Coronavirus 2 Ag Negative Negative  POCT rapid strep A  Result Value Ref Range   Rapid Strep A Screen Negative Negative        Assessment & Plan:  Problem List Items Addressed This Visit       Respiratory   Acute non-recurrent frontal sinusitis    Symptoms of cough, sore throat, ear pain, and sinus pressure started on Sunday. Negative for COVID and flu. Strep test performed - negative.  Patient has been taking Mucinex DM with adverse effects of feeling "foggy" and "crazy". -Discontinue Mucinex DM due to adverse effects. -If symptoms do not improve by Friday, start antibiotic therapy.  Prescription will be sent to the pharmacy on Friday if symptoms are unresolved  Cipro 500 mg by mouth TWICE A DAY for 10 days -Recommend symptomatic relief with honey and lemon, hot tea, humidifier, and hydration.      Relevant Medications   ciprofloxacin (CIPRO) 500 MG tablet (Start on 09/27/2023)   Pharyngitis     Strep test - negative -Gargle with warm salt water -Honey and lemon with warm tea        Other   Acute cough - Primary    Acute -Patient to try cough drops, hot tea, or honey and lemon -please call if you have trouble breathing or respiratory distress      Relevant Orders   POC COVID-19 (Completed)   POCT rapid strep A (Completed)    Meds ordered this encounter  Medications   ciprofloxacin (CIPRO) 500 MG tablet    Sig: Take 1 tablet (500 mg total) by mouth 2 (two) times daily for 10 days.    Dispense:  20 tablet    Refill:  0    Follow-up:Return if symptoms worsen or fail to improve.  Total time spent on today's visit was 52 minutes, including both face-to-face time and nonface-to-face time personally spent on review of chart (labs and imaging), discussing labs and goals, discussing further work-up, treatment options, referrals to specialist if needed, reviewing outside records of pertinent, answering patient's questions, and coordinating care.   Lajuana Matte, FNP [facility] [phone]

## 2023-09-25 NOTE — Assessment & Plan Note (Signed)
Symptoms of cough, sore throat, ear pain, and sinus pressure started on Sunday. Negative for COVID and flu. Strep test performed - negative.  Patient has been taking Mucinex DM with adverse effects of feeling "foggy" and "crazy". -Discontinue Mucinex DM due to adverse effects. -If symptoms do not improve by Friday, start antibiotic therapy.  Prescription will be sent to the pharmacy on Friday if symptoms are unresolved  Cipro 500 mg by mouth TWICE A DAY for 10 days -Recommend symptomatic relief with honey and lemon, hot tea, humidifier, and hydration.

## 2023-09-26 ENCOUNTER — Other Ambulatory Visit: Payer: Self-pay

## 2023-09-26 ENCOUNTER — Telehealth: Payer: Self-pay

## 2023-09-26 NOTE — Telephone Encounter (Signed)
Copied from CRM [id number]. Topic: Clinical - Prescription Issue >> Sep 26, 2023  9:19 AM Chantha C wrote: Reason for CRM: Pt c/o had a terrible time last night, spitting and coughing green stuff, no fever, feeling terrible and needs something today. Pt does not want to bypass the dr but and would like to started rx today. Pls send Ciprofloxacin (CIPRO) 500 MG tabletto [facility] [address], [place] - [address] [place] [address] [phone]. Pt is getting a ride to the pharmacy within the hour.   Pt was seen by Dr. Marina Goodell in the summer for sinus infection was prescribed Guaif/Codeine solution 100/10 mg dated 03/2023; pt asked can he take this with the antibotics. Pls advise and c/b [phone]

## 2023-09-26 NOTE — Telephone Encounter (Signed)
Patient is calling back stating he is at the pharmacy he stated that he doesn't hear anything back in he is feeling unwell he stated that he will go inside of pharmacy and get the medication

## 2023-09-26 NOTE — Telephone Encounter (Signed)
Spoke to patient and made him aware, that it's okay to use old cough medication that he has, and to start ABT on friday

## 2023-09-26 NOTE — Telephone Encounter (Signed)
Per provider patient is not to start ABT until Friday if he is still having symptoms.  Patient was made aware of this at the office visit.

## 2023-10-17 ENCOUNTER — Other Ambulatory Visit: Payer: Self-pay

## 2023-10-17 ENCOUNTER — Other Ambulatory Visit: Payer: Self-pay | Admitting: Family Medicine

## 2023-10-17 ENCOUNTER — Telehealth: Payer: Self-pay

## 2023-10-17 DIAGNOSIS — I10 Essential (primary) hypertension: Secondary | ICD-10-CM

## 2023-10-17 DIAGNOSIS — E782 Mixed hyperlipidemia: Secondary | ICD-10-CM

## 2023-10-17 MED ORDER — CHLORTHALIDONE 25 MG PO TABS
25.0000 mg | ORAL_TABLET | Freq: Every day | ORAL | 0 refills | Status: DC
Start: 2023-10-17 — End: 2023-12-06

## 2023-10-17 MED ORDER — PRAVASTATIN SODIUM 40 MG PO TABS: 40.0000 mg | ORAL_TABLET | Freq: Every day | ORAL | 0 refills | Status: DC

## 2023-10-17 MED ORDER — AMOXICILLIN-POT CLAVULANATE 875-125 MG PO TABS
1.0000 | ORAL_TABLET | Freq: Two times a day (BID) | ORAL | 0 refills | Status: DC
Start: 2023-10-17 — End: 2024-01-30

## 2023-10-17 NOTE — Telephone Encounter (Signed)
Patient was informed.

## 2023-10-17 NOTE — Telephone Encounter (Signed)
Copied from CRM [id number]. Topic: Clinical - Prescription Issue >> Oct 15, 2023  9:41 AM Gildardo Pounds wrote: Reason for CRM: Patient saw [facility] Levo 3 weeks ago, and gave prescription that was not strong enough and wants a new prescription for something stronger. Symptoms are still the same hurting behind eyes, headache-continuous and head congestion, sneezing and coughing. Callback number [phone].

## 2023-10-17 NOTE — Telephone Encounter (Signed)
Patient stated he is not still felling any better and wants to know if he can be sent in another antibiotic.

## 2023-12-06 ENCOUNTER — Other Ambulatory Visit: Payer: Self-pay | Admitting: Family Medicine

## 2023-12-06 DIAGNOSIS — I10 Essential (primary) hypertension: Secondary | ICD-10-CM

## 2023-12-09 ENCOUNTER — Other Ambulatory Visit: Payer: Self-pay | Admitting: Family Medicine

## 2023-12-09 DIAGNOSIS — I10 Essential (primary) hypertension: Secondary | ICD-10-CM

## 2023-12-23 ENCOUNTER — Telehealth: Payer: Self-pay | Admitting: Family Medicine

## 2023-12-23 DIAGNOSIS — I10 Essential (primary) hypertension: Secondary | ICD-10-CM

## 2023-12-23 MED ORDER — CHLORTHALIDONE 25 MG PO TABS: 25.0000 mg | ORAL_TABLET | Freq: Every day | ORAL | 0 refills | Status: DC

## 2023-12-23 NOTE — Telephone Encounter (Signed)
 Prescription Request  12/23/2023  LOV: 09/25/2023  What is the name of the medication or equipment? chlorthalidone (HYGROTON) 25 MG tablet   Have you contacted your pharmacy to request a refill? No   Which pharmacy would you like this sent to?  [facility] [address] [place] - [address] [place] [address] Phone: [phone] Fax: [phone]    Patient notified that their request is being sent to the clinical staff for review and that they should receive a response within 2 business days.   Please advise at [facility] [phone]

## 2023-12-23 NOTE — Telephone Encounter (Signed)
 Refill sent to pharmacy.

## 2024-01-05 ENCOUNTER — Other Ambulatory Visit: Payer: Self-pay | Admitting: Family Medicine

## 2024-01-05 DIAGNOSIS — I10 Essential (primary) hypertension: Secondary | ICD-10-CM

## 2024-01-28 ENCOUNTER — Ambulatory Visit: Payer: Medicare Other | Admitting: Family Medicine

## 2024-01-30 ENCOUNTER — Encounter: Payer: Self-pay | Admitting: Family Medicine

## 2024-01-30 ENCOUNTER — Ambulatory Visit: Admitting: Family Medicine

## 2024-01-30 VITALS — BP 130/80 | HR 54 | Temp 98.0°F | Resp 16 | Ht 72.0 in | Wt 212.6 lb

## 2024-01-30 DIAGNOSIS — E782 Mixed hyperlipidemia: Secondary | ICD-10-CM

## 2024-01-30 DIAGNOSIS — Z23 Encounter for immunization: Secondary | ICD-10-CM | POA: Diagnosis not present

## 2024-01-30 DIAGNOSIS — G603 Idiopathic progressive neuropathy: Secondary | ICD-10-CM | POA: Diagnosis not present

## 2024-01-30 DIAGNOSIS — I10 Essential (primary) hypertension: Secondary | ICD-10-CM

## 2024-01-30 DIAGNOSIS — R7303 Prediabetes: Secondary | ICD-10-CM | POA: Diagnosis not present

## 2024-01-30 LAB — LIPID PANEL
Chol/HDL Ratio: 2.4 ratio (ref 0.0–5.0)
Cholesterol, Total: 149 mg/dL (ref 100–199)
HDL: 61 mg/dL (ref >39)
LDL Chol Calc (NIH): 72 mg/dL (ref 0–99)
Triglycerides: 84 mg/dL (ref 0–149)
VLDL Cholesterol Cal: 16 mg/dL (ref 5–40)

## 2024-01-30 LAB — CBC WITH DIFFERENTIAL/PLATELET
Basophils Absolute: 0 10*3/uL (ref 0.0–0.2)
Basos: 0 %
EOS (ABSOLUTE): 0.2 10*3/uL (ref 0.0–0.4)
Eos: 2 %
Hematocrit: 42.4 % (ref 37.5–51.0)
Hemoglobin: 14.4 g/dL (ref 13.0–17.7)
Immature Grans (Abs): 0 10*3/uL (ref 0.0–0.1)
Immature Granulocytes: 0 %
Lymphocytes Absolute: 0.8 10*3/uL (ref 0.7–3.1)
Lymphs: 7 %
MCH: 28.1 pg (ref 26.6–33.0)
MCHC: 34 g/dL (ref 31.5–35.7)
MCV: 83 fL (ref 79–97)
Monocytes Absolute: 0.7 10*3/uL (ref 0.1–0.9)
Monocytes: 7 %
Neutrophils Absolute: 8.9 10*3/uL — ABNORMAL HIGH (ref 1.4–7.0)
Neutrophils: 84 %
Platelets: 188 10*3/uL (ref 150–450)
RBC: 5.12 x10E6/uL (ref 4.14–5.80)
RDW: 13 % (ref 11.6–15.4)
WBC: 10.7 10*3/uL (ref 3.4–10.8)

## 2024-01-30 LAB — CMP14+EGFR
ALT: 21 IU/L (ref 0–44)
AST: 29 IU/L (ref 0–40)
Albumin: 4.7 g/dL (ref 3.9–4.9)
Alkaline Phosphatase: 93 IU/L (ref 44–121)
BUN/Creatinine Ratio: 13 (ref 10–24)
BUN: 19 mg/dL (ref 8–27)
Bilirubin Total: 0.9 mg/dL (ref 0.0–1.2)
CO2: 27 mmol/L (ref 20–29)
Calcium: 10.2 mg/dL (ref 8.6–10.2)
Chloride: 91 mmol/L — ABNORMAL LOW (ref 96–106)
Creatinine, Ser: 1.43 mg/dL — ABNORMAL HIGH (ref 0.76–1.27)
Globulin, Total: 3.3 g/dL (ref 1.5–4.5)
Glucose: 96 mg/dL (ref 70–99)
Potassium: 4.5 mmol/L (ref 3.5–5.2)
Sodium: 135 mmol/L (ref 134–144)
Total Protein: 8 g/dL (ref 6.0–8.5)
eGFR: 53 mL/min/{1.73_m2} — ABNORMAL LOW (ref >59)

## 2024-01-30 LAB — HEMOGLOBIN A1C
Est. average glucose Bld gHb Est-mCnc: 128 mg/dL
Hgb A1c MFr Bld: 6.1 % — ABNORMAL HIGH (ref 4.8–5.6)

## 2024-01-30 MED ORDER — PRAVASTATIN SODIUM 40 MG PO TABS: 40.0000 mg | ORAL_TABLET | Freq: Every day | ORAL | 0 refills | Status: AC

## 2024-01-30 MED ORDER — PREGABALIN 25 MG PO CAPS: 25.0000 mg | ORAL_CAPSULE | Freq: Three times a day (TID) | ORAL | 0 refills | Status: DC

## 2024-01-30 MED ORDER — CHLORTHALIDONE 25 MG PO TABS: 25.0000 mg | ORAL_TABLET | Freq: Every day | ORAL | 1 refills | Status: AC

## 2024-01-30 NOTE — Assessment & Plan Note (Signed)
 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 Considering diabetic shoes for cushioning. - Refill Lyrica - Consider letter for diabetic shoes after podiatry evaluation.

## 2024-01-30 NOTE — Assessment & Plan Note (Signed)
 Well controlled No changes to medications Continue pravastatin 40 mg daily @ bedtime Recommend continue working on diet and exercise Labs drawn today

## 2024-01-30 NOTE — Assessment & Plan Note (Signed)
 Well controlled  No changes to medications.  BP Readings from Last 3 Encounters:  01/30/24 130/80  09/25/23 112/70  07/25/23 132/86    Continue current medication Chlorthalidone 25 mg daily, Metoprolol 12.5 mg take once daily and Aspirin 81 mg daily.   Recommend continue working on diet and exercise Labs drawn today

## 2024-01-30 NOTE — Progress Notes (Signed)
 Subjective:  Patient ID: Jack Pierce, male    DOB: 03/07/1956  Age: 68 y.o. MRN: [id number]  Chief Complaint  Patient presents with   Medical Management of Chronic Issues    Discuss diabetic shoes   Discussed the use of AI scribe software for clinical note transcription with the patient, who gave verbal consent to proceed.  HPI Jack Pierce is a 68 year old male with neuropathy and prediabetes who presents for medication refills and evaluation of foot pain.  He experiences ongoing numbness and tingling in both feet, which he attributes to neuropathy following a stroke. The sensation is described as 'ants coming up my legs' and 'toes feel like they're cannons shooting off'. He uses Lyrica for neuropathy and has been advised by his foot specialist not to discontinue it. Advil is used as needed for pain.  He experiences shortness of breath, particularly with exertion, such as walking uphill or mowing the lawn. He has a history of a heart attack two years ago, which was evaluated with a catheterization showing no damage or blockage. No EKG has been performed since then. No chest pain but shortness of breath with exertion.  He takes several medications including Lyrica, chlorthalidone, pravastatin, Protonix, Lamictal, Advil, Flonase, and aspirin. Pravastatin is taken at 40 mg at night, Protonix 40 mg twice a day, and aspirin 81 mg once a day. Flonase is used for allergies, typically one squirt in each nostril, although the bottle recommends two.  He reports difficulty finding folate supplements in the recommended 400 mg dose and is considering taking an 800 mg dose every other day. He is concerned about potential side effects of folate toxicity, such as stomach upset and irritability.  He has been referred to another specialist in [facility] for diabetic shoes due to prediabetes and foot pain, as his current podiatrist cannot assist further. He mentions the high cost of diabetic shoes  without insurance coverage.  He experiences dry mouth, which started last week, and is unsure of the cause. No changes in medication or diet that could explain this symptom.        07/25/2023   10:15 AM 12/06/2022   10:55 AM 11/28/2022    3:20 PM 06/15/2021    9:15 AM 03/13/2021    2:17 PM  Depression screen PHQ 2/9  Decreased Interest 1 0 0 0 0  Down, Depressed, Hopeless 0 0 0 0 0  PHQ - 2 Score 1 0 0 0 0  Altered sleeping 1      Tired, decreased energy 2      Change in appetite 0      Feeling bad or failure about yourself  0      Trouble concentrating 1      Moving slowly or fidgety/restless 0      Suicidal thoughts 0      PHQ-9 Score 5      Difficult doing work/chores Not difficult at all            07/25/2023   10:15 AM  Fall Risk   Falls in the past year? 0  Number falls in past yr: 0  Injury with Fall? 0  Risk for fall due to : No Fall Risks  Follow up Falls evaluation completed    Patient Care Team: Renne Crigler, FNP as PCP - General (Family Medicine)   Review of Systems  Constitutional:  Negative for appetite change, fatigue and fever.  HENT:  Negative for congestion, ear pain,  sinus pressure, sinus pain, sneezing and sore throat.   Eyes: Negative.   Respiratory:  Positive for shortness of breath. Negative for cough, chest tightness and wheezing.   Cardiovascular:  Negative for chest pain and palpitations.  Gastrointestinal:  Negative for abdominal pain, constipation, diarrhea, nausea and vomiting.  Endocrine: Negative.   Genitourinary:  Negative for dysuria and hematuria.  Musculoskeletal:  Negative for arthralgias, back pain, joint swelling and myalgias.  Skin:  Negative for rash.  Allergic/Immunologic: Negative.   Neurological:  Positive for numbness. Negative for dizziness, weakness, light-headedness and headaches.  Psychiatric/Behavioral:  Negative for dysphoric mood. The patient is not nervous/anxious.     Current Outpatient Medications on File Prior to  Visit  Medication Sig Dispense Refill   aspirin 81 MG EC tablet Take 81 mg by mouth once.     fluticasone (FLONASE) 50 MCG/ACT nasal spray Place 2 sprays into both nostrils daily. 16 g 6   folic acid (FOLATE) 400 MCG tablet Take 400 mcg by mouth daily.     ibuprofen (ADVIL) 600 MG tablet Take 1 tablet (600 mg total) by mouth 3 (three) times daily with meals. 90 tablet 3   lamoTRIgine (LAMICTAL) 100 MG tablet Take 1 tablet (100 mg total) by mouth daily. 90 tablet 2   metoprolol succinate (TOPROL-XL) 25 MG 24 hr tablet Take 1 tablet (25 mg total) by mouth daily. 90 tablet 0   pantoprazole (PROTONIX) 40 MG tablet Take 1 tablet (40 mg total) by mouth 2 (two) times daily. 180 tablet 1   nitroGLYCERIN (NITROSTAT) 0.4 MG SL tablet SMARTSIG:1 Tablet(s) Sublingual PRN (Patient not taking: Reported on 12/06/2022)     No current facility-administered medications on file prior to visit.   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, mid forehead   Past Surgical History:  Procedure Laterality Date   HERNIA REPAIR  1964 & 1967   TESTICLE REMOVAL  1981   1 removed    Family History  Problem Relation Age of Onset   Heart attack Mother    Coronary artery disease Father    CAD Brother    Suicidality Brother    CAD Sister    Social History   Socioeconomic History   Marital status: Married    Spouse name: Not on file   Number of children: 2   Years of education: Not on file   Highest education level: Not on file  Occupational History   Occupation: Retired  Tobacco Use   Smoking status: Never   Smokeless tobacco: Never  Vaping Use    Vaping status: Never Used  Substance and Sexual Activity   Alcohol use: No   Drug use: No   Sexual activity: Not on file  Other Topics Concern   Not on file  Social History Narrative   Not on file   Social Drivers of Health   Financial Resource Strain: Low Risk  (11/28/2022)   Overall Financial Resource Strain (CARDIA)    Difficulty of Paying Living Expenses: Not very hard  Food Insecurity: No Food Insecurity (11/28/2022)   Hunger Vital Sign    Worried  About Running Out of Food in the Last Year: Never true    Ran Out of Food in the Last Year: Never true  Transportation Needs: No Transportation Needs (11/28/2022)   PRAPARE - Administrator, Civil Service (Medical): No    Lack of Transportation (Non-Medical): No  Physical Activity: Inactive (11/28/2022)   Exercise Vital Sign    Days of Exercise per Week: 0 days    Minutes of Exercise per Session: 0 min  Stress: No Stress Concern Present (11/28/2022)   [employer] of Occupational Health - Occupational Stress Questionnaire    Feeling of Stress : Not at all  Social Connections: Socially Integrated (11/28/2022)   Social Connection and Isolation Panel [NHANES]    Frequency of Communication with Friends and Family: More than three times a week    Frequency of Social Gatherings with Friends and Family: More than three times a week    Attends Religious Services: More than 4 times per year    Active Member of [employer] or Organizations: Yes    Attends Engineer, structural: More than 4 times per year    Marital Status: Married    Objective:  BP 130/80   Pulse (!) 54   Temp 98 F (36.7 C) (Temporal)   Resp 16   Ht 6' (1.829 m)   Wt 212 lb 9.6 oz (96.4 kg)   SpO2 98%   BMI 28.83 kg/m      01/30/2024   10:06 AM 09/25/2023    1:27 PM 07/25/2023   10:08 AM  BP/Weight  Systolic BP 130 112 132  Diastolic BP 80 70 86  Wt. (Lbs) 212.6 218.2 211  BMI 28.83 kg/m2 29.59 kg/m2 27.24 kg/m2    Physical  Exam Constitutional:      General: He is not in acute distress.    Appearance: Normal appearance.  HENT:     Right Ear: Tympanic membrane normal.     Left Ear: Tympanic membrane normal.  Eyes:     Conjunctiva/sclera: Conjunctivae normal.  Cardiovascular:     Rate and Rhythm: Normal rate and regular rhythm.     Heart sounds: Normal heart sounds. No murmur heard. Pulmonary:     Effort: Pulmonary effort is normal.     Breath sounds: Normal breath sounds. No wheezing.  Musculoskeletal:        General: Normal range of motion.     Cervical back: Normal range of motion.  Skin:    General: Skin is warm.  Neurological:     Mental Status: He is alert. Mental status is at baseline.  Psychiatric:        Mood and Affect: Mood normal.        Behavior: Behavior normal.    Lab Results  Component Value Date   WBC 5.7 07/25/2023   HGB 13.1 07/25/2023   HCT 39.0 07/25/2023   PLT 196 07/25/2023   GLUCOSE 104 (H) 07/25/2023   CHOL 143 07/25/2023   TRIG 78 07/25/2023   HDL 55 07/25/2023   LDLCALC 73 07/25/2023   ALT 17 07/25/2023   AST 23 07/25/2023   NA 136 07/25/2023   K 4.3 07/25/2023   CL 93 (L) 07/25/2023   CREATININE 1.35 (H) 07/25/2023   BUN 16 07/25/2023   CO2 27 07/25/2023   TSH 2.850 02/06/2021   HGBA1C 6.1 (H) 07/25/2023      Assessment & Plan:   Mixed hyperlipidemia Assessment & Plan: Well controlled No changes to medications Continue  pravastatin 40 mg daily @ bedtime Recommend continue working on diet and exercise Labs drawn today  Orders: -     Lipid panel -     Pravastatin Sodium; Take 1 tablet (40 mg total) by mouth at bedtime.  Dispense: 90 tablet; Refill: 0  Idiopathic progressive neuropathy Assessment & Plan: Chronic neuropathy with numbness, tingling, and pain in feet, likely post-stroke. Considering diabetic shoes for cushioning. - Refill Lyrica - Consider letter for diabetic shoes after podiatry evaluation.  Orders: -     Pregabalin; Take 1  capsule (25 mg total) by mouth 3 (three) times daily.  Dispense: 30 capsule; Refill: 0  Essential hypertension, benign Assessment & Plan: Well controlled  No changes to medications.  BP Readings from Last 3 Encounters:  01/30/24 130/80  09/25/23 112/70  07/25/23 132/86    Continue current medication Chlorthalidone 25 mg daily, Metoprolol 12.5 mg take once daily and Aspirin 81 mg daily.   Recommend continue working on diet and exercise Labs drawn today  Orders: -     CMP14+EGFR -     CBC with Differential/Platelet -     Chlorthalidone; Take 1 tablet (25 mg total) by mouth daily.  Dispense: 90 tablet; Refill: 1  Prediabetes Assessment & Plan: Stable Last A1C 6.1% Recommend continue working on carb modified diet and increase exercise as tolerated Labs drawn, Await labs/testing for assessment and recommendations    Orders: -     Hemoglobin A1c  Immunization due -     Pneumococcal conjugate vaccine 20-valent     Meds ordered this encounter  Medications   pregabalin (LYRICA) 25 MG capsule    Sig: Take 1 capsule (25 mg total) by mouth 3 (three) times daily.    Dispense:  30 capsule    Refill:  0   chlorthalidone (HYGROTON) 25 MG tablet    Sig: Take 1 tablet (25 mg total) by mouth daily.    Dispense:  90 tablet    Refill:  1   pravastatin (PRAVACHOL) 40 MG tablet    Sig: Take 1 tablet (40 mg total) by mouth at bedtime.    Dispense:  90 tablet    Refill:  0    Orders Placed This Encounter  Procedures   Pneumococcal conjugate vaccine 20-valent   Hemoglobin A1c   Lipid panel   CMP14+EGFR   CBC with Differential/Platelet     Follow-up: Return in about 3 months (around 04/30/2024).  An After Visit Summary was printed and given to the patient.  Total time spent on today's visit was 45 minutes, including both face-to-face time and nonface-to-face time personally spent on review of chart (labs and imaging), discussing labs and goals, discussing further work-up,  treatment options, referrals to specialist if needed, reviewing outside records if pertinent, answering patient's questions, and coordinating care.    Lajuana Matte, FNP [facility] [phone]

## 2024-01-30 NOTE — Assessment & Plan Note (Signed)
 Stable Last A1C 6.1% Recommend continue working on carb modified diet and increase exercise as tolerated Labs drawn, Await labs/testing for assessment and recommendations

## 2024-03-19 ENCOUNTER — Telehealth: Payer: Self-pay | Admitting: Physician Assistant

## 2024-03-19 NOTE — Telephone Encounter (Unsigned)
 Copied from CRM [id number]. Topic: Clinical - Medication Refill >> Mar 19, 2024 11:09 AM Sophia H wrote: Medication: pregabalin  (LYRICA ) 25 MG capsule  lamoTRIgine  (LAMICTAL ) 100 MG tablet metoprolol  succinate (TOPROL -XL) 25 MG 24 hr tablet   Has the patient contacted their pharmacy? Yes (Agent: If no, request that the patient contact the pharmacy for the refill. If patient does not wish to contact the pharmacy document the reason why and proceed with request.) (Agent: If yes, when and what did the pharmacy advise?)  This is the patient's preferred pharmacy:  [facility] [address], [place] - [address] [place] [address] Phone: [phone] Fax: [phone]  Is this the correct pharmacy for this prescription? Yes If no, delete pharmacy and type the correct one.   Has the prescription been filled recently? Yes  Is the patient out of the medication? No, states has maybe 5 days left of meds  Has the patient been seen for an appointment in the last year OR does the patient have an upcoming appointment? Yes  Can we respond through MyChart? No, patient prefers phone call # [phone]  Agent: Please be advised that Rx refills may take up to 3 business days. We ask that you follow-up with your pharmacy.

## 2024-03-20 ENCOUNTER — Telehealth: Payer: Self-pay

## 2024-03-20 NOTE — Telephone Encounter (Signed)
 I left a message requesting the patient to call the office back to see about getting this appointment rescheduled either with Dr. Sirivol or Dr. Reinhold Carbine.

## 2024-03-20 NOTE — Telephone Encounter (Signed)
Appointment has been rescheduled with Dr. Faylene Kurtz

## 2024-03-20 NOTE — Telephone Encounter (Signed)
 Copied from CRM [id number]. Topic: Appointments - Scheduling Inquiry for Clinic >> Mar 19, 2024 11:15 AM Sophia H wrote: Reason for CRM: Pt is upset his next office visit is scheduled with PA Mirian Ames. States he would rather see a provider he's already seen in the past, unsure of why it was scheduled with PA Mirian Ames. Please advise if provider can be switched also wanting to know if this appointment is necessary. Ty, Best contact # [phone]

## 2024-03-24 ENCOUNTER — Other Ambulatory Visit: Payer: Self-pay | Admitting: Family Medicine

## 2024-03-24 DIAGNOSIS — G603 Idiopathic progressive neuropathy: Secondary | ICD-10-CM

## 2024-03-26 ENCOUNTER — Other Ambulatory Visit: Payer: Self-pay

## 2024-03-26 DIAGNOSIS — I10 Essential (primary) hypertension: Secondary | ICD-10-CM

## 2024-03-26 DIAGNOSIS — G603 Idiopathic progressive neuropathy: Secondary | ICD-10-CM

## 2024-03-26 MED ORDER — PREGABALIN 25 MG PO CAPS: 25.0000 mg | ORAL_CAPSULE | Freq: Three times a day (TID) | ORAL | 0 refills | Status: DC

## 2024-03-26 MED ORDER — LAMOTRIGINE 100 MG PO TABS: 100.0000 mg | ORAL_TABLET | Freq: Every day | ORAL | 2 refills | Status: DC

## 2024-03-26 MED ORDER — METOPROLOL SUCCINATE ER 25 MG PO TB24
25.0000 mg | ORAL_TABLET | Freq: Every day | ORAL | 0 refills | Status: DC
Start: 2024-03-26 — End: 2024-07-27

## 2024-03-26 NOTE — Telephone Encounter (Signed)
 Patent would like a call back regarding his medication he is needing all three of his medications filled today metoprolol  succinate (TOPROL -XL) 25 MG 24 hr tablet [[id number]]   pregabalin  (LYRICA ) 25 MG capsule [[id number]]     lamoTRIgine  (LAMICTAL ) 100 MG tablet [[id number]]

## 2024-04-02 ENCOUNTER — Ambulatory Visit

## 2024-04-02 VITALS — Ht 72.0 in | Wt 212.0 lb

## 2024-04-02 DIAGNOSIS — Z0000 Encounter for general adult medical examination without abnormal findings: Secondary | ICD-10-CM | POA: Diagnosis not present

## 2024-04-02 NOTE — Progress Notes (Signed)
 Subjective:   Jack Pierce is a 68 y.o. who presents for a Medicare Wellness preventive visit.  As a reminder, Annual Wellness Visits don't include a physical exam, and some assessments may be limited, especially if this visit is performed virtually. We may recommend an in-person follow-up visit with your provider if needed.  Visit Complete: Virtual I connected with  Jack Pierce on 04/02/24 by a audio enabled telemedicine application and verified that I am speaking with the correct person using two identifiers.  Patient Location: Home  Provider Location: Home Office  I discussed the limitations of evaluation and management by telemedicine. The patient expressed understanding and agreed to proceed.  Vital Signs: Because this visit was a virtual/telehealth visit, some criteria may be missing or patient reported. Any vitals not documented were not able to be obtained and vitals that have been documented are patient reported.  VideoDeclined- This patient declined Librarian, academic. Therefore the visit was completed with audio only.  Persons Participating in Visit: Patient.  AWV Questionnaire: No: Patient Medicare AWV questionnaire was not completed prior to this visit.  Cardiac Risk Factors include: advanced age (>50men, >44 women);hypertension;male gender     Objective:    Today's Vitals   04/02/24 1154  Weight: 212 lb (96.2 kg)  Height: 6' (1.829 m)   Body mass index is 28.75 kg/m.     04/02/2024    1:50 PM  Advanced Directives  Does Patient Have a Medical Advance Directive? Yes  Type of Estate agent of [place];Living will  Does patient want to make changes to medical advance directive? No - Patient declined  Copy of Healthcare Power of Attorney in Chart? Yes - validated most recent copy scanned in chart (See row information)    Current Medications (verified) Outpatient Encounter Medications as of 04/02/2024   Medication Sig   aspirin 81 MG EC tablet Take 81 mg by mouth once.   chlorthalidone  (HYGROTON ) 25 MG tablet Take 1 tablet (25 mg total) by mouth daily.   fluticasone  (FLONASE ) 50 MCG/ACT nasal spray Place 2 sprays into both nostrils daily.   folic acid (FOLATE) 400 MCG tablet Take 400 mcg by mouth daily.   ibuprofen  (ADVIL ) 600 MG tablet Take 1 tablet (600 mg total) by mouth 3 (three) times daily with meals.   lamoTRIgine  (LAMICTAL ) 100 MG tablet Take 1 tablet (100 mg total) by mouth daily.   metoprolol  succinate (TOPROL -XL) 25 MG 24 hr tablet Take 1 tablet (25 mg total) by mouth daily.   pantoprazole  (PROTONIX ) 40 MG tablet Take 1 tablet (40 mg total) by mouth 2 (two) times daily.   pravastatin  (PRAVACHOL ) 40 MG tablet Take 1 tablet (40 mg total) by mouth at bedtime.   pregabalin  (LYRICA ) 25 MG capsule Take 1 capsule (25 mg total) by mouth 3 (three) times daily.   nitroGLYCERIN (NITROSTAT) 0.4 MG SL tablet SMARTSIG:1 Tablet(s) Sublingual PRN (Patient not taking: Reported on 04/02/2024)   No facility-administered encounter medications on file as of 04/02/2024.    Allergies (verified) Amlodipine besylate, Cozaar [losartan potassium], Cefdinir, Meloxicam, and Doxycycline    History: Past Medical History:  Diagnosis Date   Essential (primary) hypertension    Gastro-esophageal reflux disease without esophagitis    Hyperlipidemia    Idiopathic progressive neuropathy    Impaired fasting glucose    Male erectile disorder    Other sequelae of other cerebrovascular disease    Paresthesias    Plantar fascial fibromatosis    Squamous cell  carcinoma in situ 10/25/2021   Right frontal scalp, mid forehead   Past Surgical History:  Procedure Laterality Date   HERNIA REPAIR  1964 & 1967   TESTICLE REMOVAL  1981   1 removed   Family History  Problem Relation Age of Onset   Heart attack Mother    Coronary artery disease Father    CAD Brother    Suicidality Brother    CAD Sister    Social  History   Socioeconomic History   Marital status: Married    Spouse name: Not on file   Number of children: 2   Years of education: Not on file   Highest education level: Not on file  Occupational History   Occupation: Retired  Tobacco Use   Smoking status: Never   Smokeless tobacco: Never  Vaping Use   Vaping status: Never Used  Substance and Sexual Activity   Alcohol use: No   Drug use: No   Sexual activity: Not on file  Other Topics Concern   Not on file  Social History Narrative   Not on file   Social Drivers of Health   Financial Resource Strain: Low Risk  (04/02/2024)   Overall Financial Resource Strain (CARDIA)    Difficulty of Paying Living Expenses: Not hard at all  Food Insecurity: No Food Insecurity (04/02/2024)   Hunger Vital Sign    Worried About Running Out of Food in the Last Year: Never true    Ran Out of Food in the Last Year: Never true  Transportation Needs: No Transportation Needs (04/02/2024)   PRAPARE - Administrator, Civil Service (Medical): No    Lack of Transportation (Non-Medical): No  Physical Activity: Inactive (04/02/2024)   Exercise Vital Sign    Days of Exercise per Week: 0 days    Minutes of Exercise per Session: 0 min  Stress: No Stress Concern Present (04/02/2024)   [employer] of Occupational Health - Occupational Stress Questionnaire    Feeling of Stress: Not at all  Social Connections: Socially Integrated (04/02/2024)   Social Connection and Isolation Panel    Frequency of Communication with Friends and Family: More than three times a week    Frequency of Social Gatherings with Friends and Family: More than three times a week    Attends Religious Services: More than 4 times per year    Active Member of [employer] or Organizations: Yes    Attends Engineer, structural: More than 4 times per year    Marital Status: Married    Tobacco Counseling Counseling given: Not Answered    Clinical Intake:  Pre-visit  preparation completed: Yes  Pain : No/denies pain     Diabetes: No  Lab Results  Component Value Date   HGBA1C 6.1 (H) 01/30/2024   HGBA1C 6.1 (H) 07/25/2023   HGBA1C 6.3 (H) 12/06/2022     How often do you need to have someone help you when you read instructions, pamphlets, or other written materials from your doctor or pharmacy?: 1 - Never  Interpreter Needed?: No  Information entered by :: Seabron Cypress LPN   Activities of Daily Living     04/02/2024    1:09 PM  In your present state of health, do you have any difficulty performing the following activities:  Hearing? 0  Vision? 0  Difficulty concentrating or making decisions? 0  Walking or climbing stairs? 0  Dressing or bathing? 0  Doing errands, shopping? 0  Preparing Food and  eating ? N  Using the Toilet? N  In the past six months, have you accidently leaked urine? N  Do you have problems with loss of bowel control? N  Managing your Medications? N  Managing your Finances? N  Housekeeping or managing your Housekeeping? N    Patient Care Team: Odilia Bennett, [place] as PCP - General (Physician Assistant) Fairy Homer, MD as Referring Physician (Dermatology) Pa, [facility]  I have updated your Care Teams any recent Medical Services you may have received from other providers in the past year.     Assessment:   This is a routine wellness examination for [place].  Hearing/Vision screen Hearing Screening - Comments:: Denies hearing difficulties   Vision Screening - Comments:: Wears rx glasses - up to date with routine eye exams with Dr. Marit Sides [facility])    Goals Addressed             This Visit's Progress    Remain active and independent   On track      Depression Screen     04/02/2024    1:46 PM 04/02/2024   11:52 AM 07/25/2023   10:15 AM 12/06/2022   10:55 AM 11/28/2022    3:20 PM 06/15/2021    9:15 AM 03/13/2021    2:17 PM  PHQ 2/9 Scores  PHQ - 2 Score 1 1 1  0 0 0 0  PHQ- 9 Score 4  4 5         Fall Risk     04/02/2024    1:51 PM 07/25/2023   10:15 AM 12/06/2022   10:55 AM 11/28/2022    3:21 PM 06/15/2021    9:15 AM  Fall Risk   Falls in the past year? 0 0 0 0 0  Number falls in past yr: 0 0 0 0 0  Injury with Fall? 0 0 0 0 0  Risk for fall due to : No Fall Risks No Fall Risks No Fall Risks No Fall Risks   Follow up Falls prevention discussed;Education provided;Falls evaluation completed Falls evaluation completed Falls evaluation completed Falls evaluation completed;Education provided     MEDICARE RISK AT HOME:  Medicare Risk at Home Any stairs in or around the home?: No If so, are there any without handrails?: No Home free of loose throw rugs in walkways, pet beds, electrical cords, etc?: Yes Adequate lighting in your home to reduce risk of falls?: Yes Life alert?: No Use of a cane, walker or w/c?: No Grab bars in the bathroom?: Yes Shower chair or bench in shower?: No Elevated toilet seat or a handicapped toilet?: Yes  TIMED UP AND GO:  Was the test performed?  No  Cognitive Function: 6CIT completed        04/02/2024    1:51 PM 11/28/2022    3:23 PM  6CIT Screen  What Year? 0 points 0 points  What month? 0 points 0 points  What time? 0 points 0 points  Count back from 20 0 points 0 points  Months in reverse 0 points 0 points  Repeat phrase 0 points 0 points  Total Score 0 points 0 points    Immunizations Immunization History  Administered Date(s) Administered   Fluad Quad(high Dose 65+) 06/23/2019, 09/07/2021, 08/27/2022   Fluad Trivalent(High Dose 65+) 07/25/2023   Influenza Inj Mdck Quad Pf 06/29/2020   PNEUMOCOCCAL CONJUGATE-20 01/30/2024   Pneumococcal Conjugate-13 05/31/2015   Pneumococcal Polysaccharide-23 07/05/2016   Tdap 08/08/2011   Zoster Recombinant(Shingrix) 05/06/2017, 07/09/2017  Screening Tests Health Maintenance  Topic Date Due   Hepatitis C Screening  Never done   DTaP/Tdap/Td (2 - Td or Tdap) 08/07/2021    INFLUENZA VACCINE  05/22/2024   Medicare Annual Wellness (AWV)  04/02/2025   Colonoscopy  12/01/2025   Pneumococcal Vaccine: 50+ Years  Completed   Zoster Vaccines- Shingrix  Completed   HPV VACCINES  Aged Out   Meningococcal B Vaccine  Aged Out   COVID-19 Vaccine  Discontinued    Health Maintenance  Health Maintenance Due  Topic Date Due   Hepatitis C Screening  Never done   DTaP/Tdap/Td (2 - Td or Tdap) 08/07/2021   Health Maintenance Items Addressed: Information provided on obtaining Tdap at pharmacy   Additional Screening:  Vision Screening: Recommended annual ophthalmology exams for early detection of glaucoma and other disorders of the eye. Would you like a referral to an eye doctor? No   Dental Screening: Recommended annual dental exams for proper oral hygiene  Community Resource Referral / Chronic Care Management: CRR required this visit?  No   CCM required this visit?  No   Plan:    I have personally reviewed and noted the following in the patient's chart:   Medical and social history Use of alcohol, tobacco or illicit drugs  Current medications and supplements including opioid prescriptions. Patient is not currently taking opioid prescriptions. Functional ability and status Nutritional status Physical activity Advanced directives List of other physicians Hospitalizations, surgeries, and ER visits in previous 12 months Vitals Screenings to include cognitive, depression, and falls Referrals and appointments  In addition, I have reviewed and discussed with patient certain preventive protocols, quality metrics, and best practice recommendations. A written personalized care plan for preventive services as well as general preventive health recommendations were provided to patient.   Seabron Cypress [place], [place]   12/27/6576   After Visit Summary: (Mail) Due to this being a telephonic visit, the after visit summary with patients personalized plan was  offered to patient via mail   Notes: Nothing significant to report at this time.

## 2024-04-02 NOTE — Patient Instructions (Signed)
 Jack Pierce , Thank you for taking time out of your busy schedule to complete your Annual Wellness Visit with me. I enjoyed our conversation and look forward to speaking with you again next year. I, as well as your care team,  appreciate your ongoing commitment to your health goals. Please review the following plan we discussed and let me know if I can assist you in the future. Your Game plan/ To Do List    Follow up Visits: Next Medicare AWV with our clinical staff: In 1 year    Have you seen your provider in the last 6 months (3 months if uncontrolled diabetes)? Yes Next Office Visit with your provider: 05/04/24 @ 9:20  Clinician Recommendations:  Aim for 30 minutes of exercise or brisk walking, 6-8 glasses of water, and 5 servings of fruits and vegetables each day.       This is a list of the screening recommended for you and due dates:  Health Maintenance  Topic Date Due   Hepatitis C Screening  Never done   DTaP/Tdap/Td vaccine (2 - Td or Tdap) 08/07/2021   Flu Shot  05/22/2024   Medicare Annual Wellness Visit  04/02/2025   Colon Cancer Screening  12/01/2025   Pneumococcal Vaccine for age over 57  Completed   Zoster (Shingles) Vaccine  Completed   HPV Vaccine  Aged Out   Meningitis B Vaccine  Aged Out   COVID-19 Vaccine  Discontinued    Advanced directives: (In Chart) A copy of your advanced directives are scanned into your chart should your provider ever need it.  Advance Care Planning is important because it:  [x]  Makes sure you receive the medical care that is consistent with your values, goals, and preferences  [x]  It provides guidance to your family and loved ones and reduces their decisional burden about whether or not they are making the right decisions based on your wishes.  Follow the link provided in your after visit summary or read over the paperwork we have mailed to you to help you started getting your Advance Directives in place. If you need assistance in  completing these, please reach out to us  so that we can help you!  See attachments for Preventive Care and Fall Prevention Tips.

## 2024-04-21 ENCOUNTER — Telehealth: Payer: Self-pay

## 2024-04-21 ENCOUNTER — Ambulatory Visit

## 2024-04-21 NOTE — Progress Notes (Unsigned)
 Subjective:  Patient ID: Jack Pierce, male    DOB: 1956-08-30  Age: 68 y.o. MRN: [id number]  No chief complaint on file.   HPI:     04/02/2024    1:46 PM 04/02/2024   11:52 AM 07/25/2023   10:15 AM 12/06/2022   10:55 AM 11/28/2022    3:20 PM  Depression screen PHQ 2/9  Decreased Interest 1 1 1  0 0  Down, Depressed, Hopeless 0 0 0 0 0  PHQ - 2 Score 1 1 1  0 0  Altered sleeping 1 1 1     Tired, decreased energy 1 1 2     Change in appetite 0 0 0    Feeling bad or failure about yourself  0 0 0    Trouble concentrating 1 1 1     Moving slowly or fidgety/restless 0 0 0    Suicidal thoughts 0 0 0    PHQ-9 Score 4 4 5     Difficult doing work/chores   Not difficult at all          04/02/2024    1:51 PM  Fall Risk   Falls in the past year? 0  Number falls in past yr: 0  Injury with Fall? 0  Risk for fall due to : No Fall Risks  Follow up Falls prevention discussed;Education provided;Falls evaluation completed    Patient Care Team: Milon Cleaves, [place] as PCP - General (Physician Assistant) Arnell Lye, MD as Referring Physician (Dermatology) Pa, [facility]   Review of Systems  Constitutional:  Negative for appetite change, fatigue and fever.  HENT:  Negative for congestion, ear pain, sinus pressure and sore throat.   Respiratory:  Negative for cough, chest tightness, shortness of breath and wheezing.   Cardiovascular:  Negative for chest pain and palpitations.  Gastrointestinal:  Negative for abdominal pain, constipation, diarrhea, nausea and vomiting.  Genitourinary:  Negative for dysuria and hematuria.  Musculoskeletal:  Negative for arthralgias, back pain, joint swelling and myalgias.  Skin:  Negative for rash.  Neurological:  Negative for dizziness, weakness and headaches.  Psychiatric/Behavioral:  Negative for dysphoric mood. The patient is not nervous/anxious.     Current Outpatient Medications on File Prior to Visit  Medication Sig Dispense  Refill   aspirin 81 MG EC tablet Take 81 mg by mouth once.     chlorthalidone  (HYGROTON ) 25 MG tablet Take 1 tablet (25 mg total) by mouth daily. 90 tablet 1   fluticasone  (FLONASE ) 50 MCG/ACT nasal spray Place 2 sprays into both nostrils daily. 16 g 6   folic acid (FOLATE) 400 MCG tablet Take 400 mcg by mouth daily.     ibuprofen  (ADVIL ) 600 MG tablet Take 1 tablet (600 mg total) by mouth 3 (three) times daily with meals. 90 tablet 3   lamoTRIgine  (LAMICTAL ) 100 MG tablet Take 1 tablet (100 mg total) by mouth daily. 90 tablet 2   metoprolol  succinate (TOPROL -XL) 25 MG 24 hr tablet Take 1 tablet (25 mg total) by mouth daily. 90 tablet 0   nitroGLYCERIN (NITROSTAT) 0.4 MG SL tablet SMARTSIG:1 Tablet(s) Sublingual PRN (Patient not taking: Reported on 04/02/2024)     pantoprazole  (PROTONIX ) 40 MG tablet Take 1 tablet (40 mg total) by mouth 2 (two) times daily. 180 tablet 1   pravastatin  (PRAVACHOL ) 40 MG tablet Take 1 tablet (40 mg total) by mouth at bedtime. 90 tablet 0   pregabalin  (LYRICA ) 25 MG capsule Take 1 capsule (25 mg total) by mouth 3 (three) times  daily. 30 capsule 0   No current facility-administered medications on file prior to visit.   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, mid forehead   Past Surgical History:  Procedure Laterality Date   HERNIA REPAIR  1964 & 1967   TESTICLE REMOVAL  1981   1 removed    Family History  Problem Relation Age of Onset   Heart attack Mother    Coronary artery disease Father    CAD Brother    Suicidality Brother    CAD Sister    Social History   Socioeconomic History    Marital status: Married    Spouse name: Not on file   Number of children: 2   Years of education: Not on file   Highest education level: Not on file  Occupational History   Occupation: Retired  Tobacco Use   Smoking status: Never   Smokeless tobacco: Never  Vaping Use   Vaping status: Never Used  Substance and Sexual Activity   Alcohol use: No   Drug use: No   Sexual activity: Not on file  Other Topics Concern   Not on file  Social History Narrative   Not on file   Social Drivers of Health   Financial Resource Strain: Low Risk  (04/02/2024)   Overall Financial Resource Strain (CARDIA)    Difficulty of Paying Living Expenses: Not hard at all  Food Insecurity: No Food Insecurity (04/02/2024)   Hunger Vital Sign    Worried About Running Out of Food in the Last Year: Never true    Ran Out of Food in the Last Year: Never true  Transportation Needs: No Transportation Needs (04/02/2024)   PRAPARE - Administrator, Civil Service (Medical): No    Lack of Transportation (Non-Medical): No  Physical Activity: Inactive (04/02/2024)   Exercise Vital Sign    Days of Exercise per Week: 0 days    Minutes of Exercise per Session: 0 min  Stress: No Stress Concern Present (04/02/2024)   [employer] of Occupational Health - Occupational Stress Questionnaire    Feeling of Stress: Not at all  Social Connections: Socially Integrated (04/02/2024)   Social Connection and Isolation Panel    Frequency of Communication with Friends and Family: More than three times a week    Frequency of Social Gatherings with Friends and Family: More than three times a week    Attends Religious Services: More than 4 times per year    Active Member of [employer] or Organizations: Yes    Attends Engineer, structural: More than 4 times per year    Marital Status: Married    Objective:  There were no vitals taken for this visit.     04/02/2024   11:54 AM 01/30/2024   10:06 AM 09/25/2023    1:27  PM  BP/Weight  Systolic BP -- 130 112  Diastolic BP -- 80 70  Wt. (Lbs) 212 212.6 218.2  BMI 28.75 kg/m2 28.83 kg/m2 29.59 kg/m2    Physical Exam  {Perform Simple Foot Exam  Perform Detailed exam:1} {Insert foot Exam (Optional):30965}  Lab Results  Component Value Date   WBC 10.7 01/30/2024   HGB 14.4 01/30/2024   HCT 42.4 01/30/2024   PLT 188 01/30/2024   GLUCOSE 96 01/30/2024   CHOL 149 01/30/2024   TRIG 84 01/30/2024   HDL 61 01/30/2024   LDLCALC 72 01/30/2024   ALT 21 01/30/2024   AST 29 01/30/2024   NA 135 01/30/2024   K 4.5 01/30/2024   CL 91 (L) 01/30/2024   CREATININE 1.43 (H) 01/30/2024   BUN 19 01/30/2024   CO2 27 01/30/2024   TSH 2.850 02/06/2021   HGBA1C 6.1 (H) 01/30/2024      Assessment & Plan:  There are no diagnoses linked to this encounter.   No orders of the defined types were placed in this encounter.   No orders of the defined types were placed in this encounter.    Follow-up: No follow-ups on file.   I,Lauren M Auman,acting as a Neurosurgeon for [employer], PA.,have documented all relevant documentation on the behalf of Nola Angles, PA,as directed by  Nola Angles, PA while in the presence of Nola Angles, [place].   An After Visit Summary was printed and given to the patient.  Nola Angles, [place] [facility] [phone]

## 2024-04-21 NOTE — Progress Notes (Signed)
 Patient is in office today for a nurse visit for Blood Pressure Check. Patient blood pressure was 128/76 in the right arm, 126/70 in the left arm, Patient Positive for dyspnea and fatigue, dizziness and staggering a lot.   Per Nola, Patient needs appointment. If symptoms persist or worsen to go to ED. Patient has appointment scheduled for tomorrow with Nola.

## 2024-04-21 NOTE — Telephone Encounter (Signed)
 The patient walking in the office concerned about his BP. The pts current BP log:  The patient is currently positive for the following: Dizziness HA slight [comes/goes] Staggering a lot Weakness Shortness of breath since having the stroke but no changes to this. Some blurred vision- patient said that this is so far in between this is not even worth mentioning Lightheaded  Sxs started close to a week ago. Per patient no changes to medications (I did not go through his medication list). Sxs are currently stable. The patient denies any chest pain.  Jack Pierce notified of the information listed above. Per Jack Pierce, with his high blood pressure read and low heart rate along with his other symptoms it would be best for the patient to go to the emergency department to be seen.   Pt notified and requested that we check his blood pressure at the office.  [name] checked his BP. [name] got a blood pressure read in the right arm of 128/76 and left arm 126/70 with a heart rate of 54  Per Jack Pierce, it is ok for the patient to make an appointment for tomorrow. If his symptoms get worse between now and his appointment time, the patient will need to go to the emergency department to be seen. Please make sure the patient brings his blood pressure cuff.  Pt notified. The patient has been scheduled for tomorrow morning with Jack Pierce, Jack Pierce.

## 2024-04-21 NOTE — Telephone Encounter (Signed)
 Prescription Request  04/21/2024  What is the name of the medication or equipment?  pregabalin  (LYRICA ) 25 MG capsule  Have you contacted your pharmacy to request a refill? Yes   Which pharmacy would you like this sent to?  [facility] [address] [place] - [address] [place] [address] Phone: [phone] Fax: [phone]    Patient notified that their request is being sent to the clinical staff for review and that they should receive a response within 2 business days.   Please advise at [facility] [phone]

## 2024-04-22 ENCOUNTER — Other Ambulatory Visit: Payer: Self-pay | Admitting: Physician Assistant

## 2024-04-22 ENCOUNTER — Ambulatory Visit (INDEPENDENT_AMBULATORY_CARE_PROVIDER_SITE_OTHER): Admitting: Physician Assistant

## 2024-04-22 ENCOUNTER — Encounter: Payer: Self-pay | Admitting: Physician Assistant

## 2024-04-22 VITALS — BP 130/71 | HR 47 | Temp 97.3°F | Resp 16 | Ht 72.0 in | Wt 208.0 lb

## 2024-04-22 DIAGNOSIS — R7303 Prediabetes: Secondary | ICD-10-CM | POA: Diagnosis not present

## 2024-04-22 DIAGNOSIS — R0602 Shortness of breath: Secondary | ICD-10-CM | POA: Insufficient documentation

## 2024-04-22 DIAGNOSIS — J0110 Acute frontal sinusitis, unspecified: Secondary | ICD-10-CM

## 2024-04-22 DIAGNOSIS — G603 Idiopathic progressive neuropathy: Secondary | ICD-10-CM

## 2024-04-22 DIAGNOSIS — G44229 Chronic tension-type headache, not intractable: Secondary | ICD-10-CM | POA: Insufficient documentation

## 2024-04-22 DIAGNOSIS — E782 Mixed hyperlipidemia: Secondary | ICD-10-CM | POA: Diagnosis not present

## 2024-04-22 DIAGNOSIS — I10 Essential (primary) hypertension: Secondary | ICD-10-CM | POA: Diagnosis not present

## 2024-04-22 MED ORDER — PREGABALIN 25 MG PO CAPS
25.0000 mg | ORAL_CAPSULE | Freq: Three times a day (TID) | ORAL | 0 refills | Status: DC
Start: 2024-04-22 — End: 2024-05-15

## 2024-04-22 MED ORDER — AZITHROMYCIN 250 MG PO TABS: ORAL_TABLET | ORAL | 0 refills | Status: AC

## 2024-04-22 NOTE — Assessment & Plan Note (Signed)
 Ear pressure and balance issues possibly related to allergies or sinus issues. Bulging eardrum suggests sinus involvement. - Prescribe azithromycin  (Z-Pak). - Advise on allergy management if symptoms persist.

## 2024-04-22 NOTE — Assessment & Plan Note (Signed)
 Intermittent hypertension with recent fluctuations. History of stroke 12 years ago. Dizziness and unstable walking possibly related to blood pressure. Blood pressure management crucial due to cardiovascular history. Potential kidney or thyroid involvement requires evaluation. - Order echocardiogram to assess heart function. - Perform blood tests for kidney function, thyroid function, and CBC. - Monitor blood pressure regularly. - Consult cardiologist at [facility]. - Advise to avoid overexertion. - Recommend obtaining new blood pressure cuff, possibly through Medicare.

## 2024-04-22 NOTE — Assessment & Plan Note (Signed)
 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 Symptoms include numbness, burning, decreased sensation, and toenail discoloration. Neuropathy may affect gait. - Consider nerve conduction study. - Refer to podiatrist for toenail evaluation. - Advise on appropriate footwear.

## 2024-04-22 NOTE — Assessment & Plan Note (Signed)
 Controlled Continue to monitor diet and exercise Continue taking pravastatin  40mg  as prescribed Labs drawn Will adjust treatment based on results Lab Results  Component Value Date   LDLCALC 72 01/30/2024

## 2024-04-22 NOTE — Assessment & Plan Note (Signed)
 Labs drawn today Continue with diet and exercise Will adjust treatment depending on results Lab Results  Component Value Date   HGBA1C 6.1 (H) 01/30/2024   HGBA1C 6.1 (H) 07/25/2023   HGBA1C 6.3 (H) 12/06/2022

## 2024-04-22 NOTE — Patient Instructions (Signed)
 VISIT SUMMARY:  Today, we discussed your concerns about fluctuating blood pressure, dizziness, shortness of breath, headaches, peripheral neuropathy, and ear pressure. We reviewed your medical history, including your past stroke and family history of heart disease. We have outlined a plan to address each of these issues and ensure your overall health is monitored closely.  YOUR PLAN:  -HYPERTENSION: Hypertension means high blood pressure, which can lead to serious health problems if not managed properly. We will order an echocardiogram to check your heart function and perform blood tests to evaluate your kidney and thyroid function. It's important to monitor your blood pressure regularly, avoid overexertion, and consider getting a new blood pressure cuff through Medicare. We will also consult with a cardiologist at [facility].  -SHORTNESS OF BREATH: Shortness of breath can be a sign of heart problems. We will order an echocardiogram and consult with a cardiologist at [facility] to investigate further.  -HEADACHES: Headaches can sometimes be related to high blood pressure. We will monitor your blood pressure and headache patterns closely. If you experience severe headaches or vision changes, seek emergency care immediately.  -PERIPHERAL NEUROPATHY: Peripheral neuropathy is nerve damage that causes numbness, burning, and decreased sensation, often in the feet. We will consider a nerve conduction study and refer you to a podiatrist for toenail evaluation. Wearing appropriate footwear is also important.  -EAR PRESSURE: Ear pressure and balance issues may be related to allergies or sinus problems. We will prescribe azithromycin  (Z-Pak) and advise you on managing allergy symptoms if they persist.  -GENERAL HEALTH MAINTENANCE: Routine health maintenance is important for your overall well-being. We will perform blood tests, including cholesterol and A1c, and schedule a follow-up in one  month.  INSTRUCTIONS:  Please schedule a follow-up appointment in one month. Ensure that you complete the blood tests and echocardiogram before your next visit. We will coordinate the echocardiogram scheduling and results with the cardiologist. Monitor your blood pressure regularly and seek emergency care if you experience severe headaches or vision changes.

## 2024-04-22 NOTE — Assessment & Plan Note (Signed)
 Headaches with pain level 4/10, possibly related to blood pressure. Severe, one-sided headaches may indicate vascular issue. - Monitor blood pressure and headache patterns. - Advise emergency care if severe or with vision changes.

## 2024-04-23 LAB — CBC WITH DIFFERENTIAL/PLATELET
Basophils Absolute: 0.1 10*3/uL (ref 0.0–0.2)
Basos: 1 %
EOS (ABSOLUTE): 0.2 10*3/uL (ref 0.0–0.4)
Eos: 3 %
Hematocrit: 38.5 % (ref 37.5–51.0)
Hemoglobin: 12.9 g/dL — ABNORMAL LOW (ref 13.0–17.7)
Immature Grans (Abs): 0 10*3/uL (ref 0.0–0.1)
Immature Granulocytes: 0 %
Lymphocytes Absolute: 1.2 10*3/uL (ref 0.7–3.1)
Lymphs: 22 %
MCH: 28 pg (ref 26.6–33.0)
MCHC: 33.5 g/dL (ref 31.5–35.7)
MCV: 84 fL (ref 79–97)
Monocytes Absolute: 0.6 10*3/uL (ref 0.1–0.9)
Monocytes: 12 %
Neutrophils Absolute: 3.3 10*3/uL (ref 1.4–7.0)
Neutrophils: 62 %
Platelets: 193 10*3/uL (ref 150–450)
RBC: 4.6 x10E6/uL (ref 4.14–5.80)
RDW: 13.1 % (ref 11.6–15.4)
WBC: 5.4 10*3/uL (ref 3.4–10.8)

## 2024-04-23 LAB — COMPREHENSIVE METABOLIC PANEL WITH GFR
ALT: 14 IU/L (ref 0–44)
AST: 26 IU/L (ref 0–40)
Albumin: 4.6 g/dL (ref 3.9–4.9)
Alkaline Phosphatase: 86 IU/L (ref 44–121)
BUN/Creatinine Ratio: 11 (ref 10–24)
BUN: 16 mg/dL (ref 8–27)
Bilirubin Total: 0.8 mg/dL (ref 0.0–1.2)
CO2: 24 mmol/L (ref 20–29)
Calcium: 9.7 mg/dL (ref 8.6–10.2)
Chloride: 94 mmol/L — ABNORMAL LOW (ref 96–106)
Creatinine, Ser: 1.5 mg/dL — ABNORMAL HIGH (ref 0.76–1.27)
Globulin, Total: 3 g/dL (ref 1.5–4.5)
Glucose: 101 mg/dL — ABNORMAL HIGH (ref 70–99)
Potassium: 4 mmol/L (ref 3.5–5.2)
Sodium: 136 mmol/L (ref 134–144)
Total Protein: 7.6 g/dL (ref 6.0–8.5)
eGFR: 50 mL/min/{1.73_m2} — ABNORMAL LOW (ref >59)

## 2024-04-23 LAB — LIPID PANEL
Chol/HDL Ratio: 2.7 ratio (ref 0.0–5.0)
Cholesterol, Total: 134 mg/dL (ref 100–199)
HDL: 50 mg/dL (ref >39)
LDL Chol Calc (NIH): 70 mg/dL (ref 0–99)
Triglycerides: 66 mg/dL (ref 0–149)
VLDL Cholesterol Cal: 14 mg/dL (ref 5–40)

## 2024-04-23 LAB — HEMOGLOBIN A1C
Est. average glucose Bld gHb Est-mCnc: 123 mg/dL
Hgb A1c MFr Bld: 5.9 % — ABNORMAL HIGH (ref 4.8–5.6)

## 2024-04-23 LAB — T4, FREE: Free T4: 1.33 ng/dL (ref 0.82–1.77)

## 2024-04-23 LAB — TSH: TSH: 3.53 u[IU]/mL (ref 0.450–4.500)

## 2024-04-28 ENCOUNTER — Ambulatory Visit: Payer: Self-pay | Admitting: Physician Assistant

## 2024-04-28 ENCOUNTER — Other Ambulatory Visit: Payer: Self-pay | Admitting: Family Medicine

## 2024-04-28 DIAGNOSIS — I10 Essential (primary) hypertension: Secondary | ICD-10-CM

## 2024-04-28 DIAGNOSIS — K219 Gastro-esophageal reflux disease without esophagitis: Secondary | ICD-10-CM

## 2024-04-28 DIAGNOSIS — R944 Abnormal results of kidney function studies: Secondary | ICD-10-CM

## 2024-05-04 ENCOUNTER — Other Ambulatory Visit: Payer: Self-pay

## 2024-05-04 ENCOUNTER — Ambulatory Visit

## 2024-05-04 MED ORDER — BLOOD PRESSURE MONITOR AUTOMAT DEVI: 0 refills | Status: DC

## 2024-05-05 ENCOUNTER — Other Ambulatory Visit: Payer: Self-pay

## 2024-05-05 ENCOUNTER — Ambulatory Visit

## 2024-05-05 DIAGNOSIS — I270 Primary pulmonary hypertension: Secondary | ICD-10-CM

## 2024-05-05 MED ORDER — BLOOD PRESSURE MONITOR AUTOMAT DEVI: 0 refills | Status: DC

## 2024-05-06 ENCOUNTER — Inpatient Hospital Stay (HOSPITAL_BASED_OUTPATIENT_CLINIC_OR_DEPARTMENT_OTHER)
Admission: RE | Admit: 2024-05-06 | Discharge: 2024-05-06 | Disposition: A | Discharge status: Home / Self Care | Source: Ambulatory Visit | Attending: Physician Assistant | Admitting: Radiology

## 2024-05-06 DIAGNOSIS — R944 Abnormal results of kidney function studies: Secondary | ICD-10-CM

## 2024-05-06 DIAGNOSIS — I10 Essential (primary) hypertension: Secondary | ICD-10-CM | POA: Diagnosis not present

## 2024-05-11 ENCOUNTER — Ambulatory Visit: Payer: Self-pay | Admitting: Physician Assistant

## 2024-05-12 ENCOUNTER — Ambulatory Visit: Attending: Physician Assistant

## 2024-05-12 DIAGNOSIS — I10 Essential (primary) hypertension: Secondary | ICD-10-CM | POA: Diagnosis present

## 2024-05-13 LAB — ECHOCARDIOGRAM COMPLETE
AR max vel: 1.91 cm2
AV Area VTI: 1.84 cm2
AV Area mean vel: 1.7 cm2
AV Mean grad: 4.3 mmHg
AV Peak grad: 9.4 mmHg
Ao pk vel: 1.53 m/s
Area-P 1/2: 2.33 cm2
MV M vel: 4.88 m/s
MV Peak grad: 95.3 mmHg
MV VTI: 1.52 cm2
S' Lateral: 3.4 cm

## 2024-05-14 ENCOUNTER — Other Ambulatory Visit: Payer: Self-pay | Admitting: Physician Assistant

## 2024-05-14 DIAGNOSIS — G603 Idiopathic progressive neuropathy: Secondary | ICD-10-CM

## 2024-05-25 ENCOUNTER — Ambulatory Visit: Admitting: Physician Assistant

## 2024-05-25 ENCOUNTER — Encounter: Payer: Self-pay | Admitting: Physician Assistant

## 2024-05-25 VITALS — BP 118/78 | HR 51 | Temp 98.7°F | Resp 16 | Ht 72.0 in | Wt 207.2 lb

## 2024-05-25 DIAGNOSIS — J309 Allergic rhinitis, unspecified: Secondary | ICD-10-CM | POA: Diagnosis not present

## 2024-05-25 DIAGNOSIS — Z8673 Personal history of transient ischemic attack (TIA), and cerebral infarction without residual deficits: Secondary | ICD-10-CM

## 2024-05-25 DIAGNOSIS — K219 Gastro-esophageal reflux disease without esophagitis: Secondary | ICD-10-CM

## 2024-05-25 DIAGNOSIS — R519 Headache, unspecified: Secondary | ICD-10-CM

## 2024-05-25 DIAGNOSIS — I209 Angina pectoris, unspecified: Secondary | ICD-10-CM

## 2024-05-25 DIAGNOSIS — R55 Syncope and collapse: Secondary | ICD-10-CM

## 2024-05-25 DIAGNOSIS — G8929 Other chronic pain: Secondary | ICD-10-CM

## 2024-05-25 MED ORDER — NITROGLYCERIN 0.4 MG SL SUBL: 0.4000 mg | SUBLINGUAL_TABLET | SUBLINGUAL | 0 refills | Status: AC | PRN

## 2024-05-25 MED ORDER — PANTOPRAZOLE SODIUM 40 MG PO TBEC: 40.0000 mg | DELAYED_RELEASE_TABLET | Freq: Two times a day (BID) | ORAL | 1 refills | Status: AC

## 2024-05-25 MED ORDER — FLUTICASONE PROPIONATE 50 MCG/ACT NA SUSP: 2.0000 | Freq: Every day | NASAL | 6 refills | Status: AC

## 2024-05-25 NOTE — Progress Notes (Signed)
 Subjective:  Patient ID: Jack Pierce, male    DOB: 01-Jul-1956  Age: 68 y.o. MRN: [id number]  Chief Complaint  Patient presents with   Medical Management of Chronic Issues    Hypertension    HPI:  Discussed the use of AI scribe software for clinical note transcription with the patient, who gave verbal consent to proceed.  History of Present Illness   Jack Pierce is a 68 year old male with a history of stroke who presents with episodes of dizziness and low heart rate.  He experiences episodes of dizziness, particularly when transitioning from sitting to standing, necessitating holding onto objects to prevent falling. His heart rate is typically low, around 47-48 beats per minute, and his blood pressure has been variable.  He feels uneasy and nervous, particularly on a recent Saturday night, which led him to go to bed unusually early. 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  He recalls a previous cardiology evaluation where no heart damage or blockages were found. He has not experienced numbness or tingling since his stroke. He is concerned about a past blood clot and its implications for future air travel, as he plans a trip with his family next year.  He mentions a recent prostate exam where blood work indicated potential kidney issues, but subsequent tests showed normal results. He has a history of bruising easily and recently noticed a bruise on his gum, which he attributes to brushing too hard. He has not used nitroglycerin  in three years and is considering replacing it due to its age.          05/25/2024   10:40 AM 04/02/2024    1:46 PM 04/02/2024   11:52 AM 07/25/2023   10:15 AM 12/06/2022   10:55 AM  Depression screen PHQ 2/9  Decreased Interest 0 1 1 1  0  Down, Depressed, Hopeless 0 0 0  0 0  PHQ - 2 Score 0 1 1 1  0  Altered sleeping 1 1 1 1    Tired, decreased energy 1 1 1 2    Change in appetite 0 0 0 0   Feeling bad or failure about yourself  0 0 0 0   Trouble concentrating 0 1 1 1    Moving slowly or fidgety/restless 0 0 0 0   Suicidal thoughts 0 0 0 0   PHQ-9 Score 2 4 4 5    Difficult doing work/chores Not difficult at all   Not difficult at all         05/25/2024   10:39 AM  Fall Risk   Falls in the past year? 1  Number falls in past yr: 0  Injury with Fall? 0  Risk for fall due to : No Fall Risks  Follow up Falls evaluation completed    Patient Care Team: Milon Cleaves, [place] as PCP - General (Physician Assistant) Arnell Lye, MD as Referring Physician (Dermatology) Pa, [facility]   Review of Systems  Constitutional:  Negative for appetite change, fatigue and fever.  HENT:  Negative for congestion, ear pain, sinus pressure and sore throat.   Eyes: Negative.   Respiratory:  Negative for cough, chest tightness, shortness of breath and wheezing.   Cardiovascular:  Negative for chest pain and palpitations.  Gastrointestinal:  Negative for abdominal pain, constipation, diarrhea, nausea and vomiting.  Endocrine: Negative.   Genitourinary:  Negative for dysuria, frequency, hematuria and urgency.  Musculoskeletal:  Negative for arthralgias, back pain, joint swelling and myalgias.  Skin:  Negative for rash.  Allergic/Immunologic: Negative.   Neurological:  Negative for dizziness, weakness, light-headedness and headaches.  Hematological: Negative.   Psychiatric/Behavioral:  Negative for dysphoric mood. The patient is not nervous/anxious.     Current Outpatient Medications on File Prior to Visit  Medication Sig Dispense Refill   aspirin 81 MG EC tablet Take 81 mg by mouth once.     Blood Pressure Monitoring (BLOOD PRESSURE MONITOR AUTOMAT) DEVI Use daily to check blood pressure. 1 each 0   chlorthalidone  (HYGROTON ) 25 MG tablet Take 1 tablet  (25 mg total) by mouth daily. 90 tablet 1   folic acid (FOLATE) 400 MCG tablet Take 400 mcg by mouth daily.     ibuprofen  (ADVIL ) 600 MG tablet Take 1 tablet (600 mg total) by mouth 3 (three) times daily with meals. 90 tablet 3   lamoTRIgine  (LAMICTAL ) 100 MG tablet Take 1 tablet (100 mg total) by mouth daily. 90 tablet 2   metoprolol  succinate (TOPROL -XL) 25 MG 24 hr tablet Take 1 tablet (25 mg total) by mouth daily. 90 tablet 0   pravastatin  (PRAVACHOL ) 40 MG tablet Take 1 tablet (40 mg total) by mouth at bedtime. 90 tablet 0   pregabalin  (LYRICA ) 25 MG capsule TAKE 1 CAPSULE BY MOUTH THREE TIMES DAILY 30 capsule 0   No current facility-administered medications on file prior to visit.   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, mid forehead   Past Surgical History:  Procedure Laterality Date   HERNIA REPAIR  1964 & 1967   TESTICLE REMOVAL  1981   1 removed    Family History  Problem Relation Age of Onset   Heart attack Mother    Coronary artery disease Father    CAD Brother    Suicidality Brother    CAD Sister    Social History   Socioeconomic History   Marital status: Married    Spouse name: Not on file   Number of children: 2   Years of education: Not on file   Highest education level: Not on file  Occupational History   Occupation: Retired  Tobacco Use   Smoking status: Never   Smokeless tobacco: Never  Vaping Use   Vaping status: Never Used  Substance and Sexual Activity   Alcohol use: No   Drug use: No   Sexual activity: Not on file  Other Topics Concern   Not on file  Social History Narrative    Not on file   Social Drivers of Health   Financial Resource Strain: Low Risk  (04/02/2024)   Overall Financial Resource Strain (CARDIA)    Difficulty of Paying Living Expenses: Not hard at all  Food Insecurity: No Food Insecurity (04/02/2024)   Hunger Vital Sign    Worried About Running Out of Food in the Last Year: Never true    Ran Out of Food in the Last Year: Never true  Transportation Needs: No Transportation Needs (04/02/2024)  PRAPARE - Administrator, Civil Service (Medical): No    Lack of Transportation (Non-Medical): No  Physical Activity: Inactive (04/02/2024)   Exercise Vital Sign    Days of Exercise per Week: 0 days    Minutes of Exercise per Session: 0 min  Stress: No Stress Concern Present (04/02/2024)   [employer] of Occupational Health - Occupational Stress Questionnaire    Feeling of Stress: Not at all  Social Connections: Socially Integrated (04/02/2024)   Social Connection and Isolation Panel    Frequency of Communication with Friends and Family: More than three times a week    Frequency of Social Gatherings with Friends and Family: More than three times a week    Attends Religious Services: More than 4 times per year    Active Member of [employer] or Organizations: Yes    Attends Engineer, structural: More than 4 times per year    Marital Status: Married    Objective:  BP 118/78   Pulse (!) 51   Temp 98.7 F (37.1 C) (Temporal)   Resp 16   Ht 6' (1.829 m)   Wt 207 lb 3.2 oz (94 kg)   SpO2 98%   BMI 28.10 kg/m      05/25/2024   10:41 AM 04/22/2024   10:07 AM 04/22/2024    9:46 AM  BP/Weight  Systolic BP 118 130 118  Diastolic BP 78 71 80  Wt. (Lbs) 207.2  208  BMI 28.1 kg/m2  28.21 kg/m2    Physical Exam Vitals reviewed.  Constitutional:      Appearance: Normal appearance.  HENT:     Mouth/Throat:     Dentition: Gum lesions present.     Comments: Small edema noted on bottom left gum line. No sign of infection or bleeding   Neck:     Vascular: No carotid bruit.  Cardiovascular:     Rate and Rhythm: Normal rate and regular rhythm.     Heart sounds: Normal heart sounds.  Pulmonary:     Effort: Pulmonary effort is normal.     Breath sounds: Normal breath sounds.  Abdominal:     General: Bowel sounds are normal.     Palpations: Abdomen is soft.     Tenderness: There is no abdominal tenderness.  Neurological:     Mental Status: He is alert and oriented to person, place, and time.  Psychiatric:        Mood and Affect: Mood normal.        Behavior: Behavior normal.         Lab Results  Component Value Date   WBC 5.4 04/22/2024   HGB 12.9 (L) 04/22/2024   HCT 38.5 04/22/2024   PLT 193 04/22/2024   GLUCOSE 101 (H) 04/22/2024   CHOL 134 04/22/2024   TRIG 66 04/22/2024   HDL 50 04/22/2024   LDLCALC 70 04/22/2024   ALT 14 04/22/2024   AST 26 04/22/2024   NA 136 04/22/2024   K 4.0 04/22/2024   CL 94 (L) 04/22/2024   CREATININE 1.50 (H) 04/22/2024   BUN 16 04/22/2024   CO2 24 04/22/2024   TSH 3.530 04/22/2024   HGBA1C 5.9 (H) 04/22/2024      Assessment & Plan:  Angina pectoris (HCC) Assessment & Plan: Bradycardia with heart rate in the upper 40s causing dizziness. Blood pressure controlled. - Switch to short-acting metoprolol . - Keep cardiology appointment in two weeks.  Orders: -     Nitroglycerin ; Place 1  tablet (0.4 mg total) under the tongue every 5 (five) minutes as needed for chest pain.  Dispense: 3 tablet; Refill: 0  Gastro-esophageal reflux disease without esophagitis Assessment & Plan: Controlled Continue to take pantoprazole  40mg  as prescribed Denies any changing in symptoms  Orders: -     Pantoprazole  Sodium; Take 1 tablet (40 mg total) by mouth 2 (two) times daily.  Dispense: 180 tablet; Refill: 1  Allergic sinusitis Assessment & Plan: Controlled Continue using Flonase  as directed Continue to monitor symptoms Will adjust treatment based on symptoms  Orders: -      Fluticasone  Propionate; Place 2 sprays into both nostrils daily.  Dispense: 16 g; Refill: 6  Syncope, unspecified syncope type Assessment & Plan: Bradycardia with heart rate in the upper 40s causing dizziness. Blood pressure controlled. - Switch to short-acting metoprolol . - Keep cardiology appointment in two weeks.  Orders: -     CT HEAD WO CONTRAST ( ); Future  History of stroke Assessment & Plan: History of ischemic stroke in 2012. No current neurological symptoms. Patient requested confirmation of clot resolution before flying. - Order CT of the brain without contrast. - Consider MRI or CT with contrast if initial CT is inconclusive. - Review previous imaging for comparison.  Orders: -     CT HEAD WO CONTRAST ( ); Future  Chronic nonintractable headache, unspecified headache type Assessment & Plan: Increased in frequency along with dizziness Is concerned due to previous stroke Will look into imaging   Orders: -     CT HEAD WO CONTRAST ( ); Future   Tooth bruise, not infected Bruise on tooth likely from brushing too hard. No infection or tenderness. - Monitor for changes. - Consult dentist if symptoms worsen or persist.  General Health Maintenance Discussed need for updated nitroglycerin  prescription. No new blood work needed. - Refill nitroglycerin  prescription. - Dispose of expired nitroglycerin  at sheriff's office.       Meds ordered this encounter  Medications   pantoprazole  (PROTONIX ) 40 MG tablet    Sig: Take 1 tablet (40 mg total) by mouth 2 (two) times daily.    Dispense:  180 tablet    Refill:  1   nitroGLYCERIN  (NITROSTAT ) 0.4 MG SL tablet    Sig: Place 1 tablet (0.4 mg total) under the tongue every 5 (five) minutes as needed for chest pain.    Dispense:  3 tablet    Refill:  0   fluticasone  (FLONASE ) 50 MCG/ACT nasal spray    Sig: Place 2 sprays into both nostrils daily.    Dispense:  16 g    Refill:  6    Orders Placed This Encounter   Procedures   CT HEAD WO CONTRAST ( )     Follow-up: Return in about 3 months (around 08/25/2024) for Chronic, Jack.   I,Angela Taylor,acting as a Neurosurgeon for [employer], PA.,have documented all relevant documentation on the behalf of Jack Angles, PA,as directed by  Jack Angles, PA while in the presence of Jack Pierce, [place].   An After Visit Summary was printed and given to the patient.  Jack Pierce, [place] [facility] [phone]

## 2024-06-01 ENCOUNTER — Telehealth: Payer: Self-pay

## 2024-06-01 NOTE — Telephone Encounter (Signed)
 Copied from CRM #[id number]. Topic: Clinical - Request for Lab/Test Order >> May 29, 2024  9:45 AM Tobias CROME wrote: Reason for CRM: Patient states he has not heard from anyone about brain scan. Patient states he saw Nola Angles, [place] on 05/25/24 and discussed getting scan done before seeing cardiologist. Patient states he spoke to provider about having scan done and having results sent to his cardiologist, patient sees cardiologist on 06/09/24.   Patient requesting order to be placed for brain scan (looking for blood clot or bleed in brain).

## 2024-06-02 ENCOUNTER — Encounter: Payer: Self-pay | Admitting: Physician Assistant

## 2024-06-02 ENCOUNTER — Other Ambulatory Visit: Payer: Self-pay

## 2024-06-02 ENCOUNTER — Telehealth: Payer: Self-pay

## 2024-06-02 DIAGNOSIS — R519 Headache, unspecified: Secondary | ICD-10-CM | POA: Insufficient documentation

## 2024-06-02 DIAGNOSIS — G8929 Other chronic pain: Secondary | ICD-10-CM | POA: Insufficient documentation

## 2024-06-02 DIAGNOSIS — I209 Angina pectoris, unspecified: Secondary | ICD-10-CM | POA: Insufficient documentation

## 2024-06-02 DIAGNOSIS — R55 Syncope and collapse: Secondary | ICD-10-CM | POA: Insufficient documentation

## 2024-06-02 DIAGNOSIS — Z8673 Personal history of transient ischemic attack (TIA), and cerebral infarction without residual deficits: Secondary | ICD-10-CM | POA: Insufficient documentation

## 2024-06-02 DIAGNOSIS — J309 Allergic rhinitis, unspecified: Secondary | ICD-10-CM | POA: Insufficient documentation

## 2024-06-02 NOTE — Assessment & Plan Note (Signed)
 Controlled Continue using Flonase  as directed Continue to monitor symptoms Will adjust treatment based on symptoms

## 2024-06-02 NOTE — Patient Instructions (Addendum)
 VISIT SUMMARY:  During your visit, we discussed your episodes of dizziness, low heart rate, and other concerns. We reviewed your history of stroke and recent symptoms, and we have outlined a plan to address each issue.  YOUR PLAN:  -ISCHEMIC STROKE, LEFT CEREBRAL HEMISPHERE: An ischemic stroke occurs when a blood clot blocks or narrows an artery leading to the brain. You have a history of this type of stroke from 2012. To ensure there are no remaining clots before your planned air travel next year, we will consider a CT scan of your brain without contrast after cardiology appointment. If the results are unclear, we may consider additional imaging with contrast. We will also review your previous imaging for comparison.  -BRADYCARDIA AND HYPOTENSION WITH ASSOCIATED DIZZINESS AND LIGHTHEADEDNESS: Bradycardia is a condition where your heart rate is slower than normal, which can cause dizziness and lightheadedness. Your heart rate has been in the upper 40s. We will switch you to a short-acting form of metoprolol  to manage this. Please keep your cardiology appointment in two weeks.  -SHORTNESS OF BREATH: You have been experiencing intermittent shortness of breath, which may be related to stress or cardiovascular issues. We will continue to monitor this symptom.  -CHRONIC STRESS REACTION: Chronic stress can contribute to various physical symptoms. 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 We will continue to monitor your stress and its impact on your health.  -TOOTH BRUISE, NOT INFECTED: You have a bruise on your gum likely caused by brushing too hard. There is no infection or tenderness. Please monitor for any changes and consult a dentist if the symptoms worsen or persist.  -GENERAL HEALTH MAINTENANCE: We discussed the need to update your nitroglycerin  prescription. Please refill your prescription and dispose of the expired nitroglycerin  at the sheriff's  office. No new blood work is needed at this time.  INSTRUCTIONS:  Please follow up with the CT scan of your brain as ordered. Keep your cardiology appointment in two weeks. Refill your nitroglycerin  prescription and dispose of the expired one at the sheriff's office. Monitor your gum bruise and consult a dentist if it worsens or persists.

## 2024-06-02 NOTE — Telephone Encounter (Signed)
 Copied from CRM [id number]. Topic: Referral - Status >> Jun 01, 2024  4:39 PM Deleta RAMAN wrote: Reason for CRM: patient is calling about referral for ct scan. The orders are not in and patient is concern about the status of ct. Please contact the patient wife at [phone] anytime after 12pm or phone number on file

## 2024-06-02 NOTE — Assessment & Plan Note (Signed)
 Increased in frequency along with dizziness Is concerned due to previous stroke Will look into imaging

## 2024-06-02 NOTE — Assessment & Plan Note (Signed)
 Controlled Continue to take pantoprazole  40mg  as prescribed Denies any changing in symptoms

## 2024-06-02 NOTE — Assessment & Plan Note (Signed)
 History of ischemic stroke in 2012. No current neurological symptoms. Patient requested confirmation of clot resolution before flying. - Order CT of the brain without contrast. - Consider MRI or CT with contrast if initial CT is inconclusive. - Review previous imaging for comparison.

## 2024-06-02 NOTE — Assessment & Plan Note (Signed)
 Bradycardia with heart rate in the upper 40s causing dizziness. Blood pressure controlled. - Switch to short-acting metoprolol . - Keep cardiology appointment in two weeks.

## 2024-06-03 ENCOUNTER — Ambulatory Visit (INDEPENDENT_AMBULATORY_CARE_PROVIDER_SITE_OTHER)
Admission: RE | Admit: 2024-06-03 | Discharge: 2024-06-03 | Disposition: A | Discharge status: Home / Self Care | Source: Ambulatory Visit | Attending: Physician Assistant | Admitting: Physician Assistant

## 2024-06-03 DIAGNOSIS — R55 Syncope and collapse: Secondary | ICD-10-CM

## 2024-06-03 DIAGNOSIS — Z8547 Personal history of malignant neoplasm of testis: Secondary | ICD-10-CM | POA: Diagnosis not present

## 2024-06-03 DIAGNOSIS — Z85820 Personal history of malignant melanoma of skin: Secondary | ICD-10-CM

## 2024-06-03 DIAGNOSIS — R519 Headache, unspecified: Secondary | ICD-10-CM

## 2024-06-03 DIAGNOSIS — Z85828 Personal history of other malignant neoplasm of skin: Secondary | ICD-10-CM

## 2024-06-03 DIAGNOSIS — Z8673 Personal history of transient ischemic attack (TIA), and cerebral infarction without residual deficits: Secondary | ICD-10-CM | POA: Diagnosis not present

## 2024-06-03 DIAGNOSIS — G8929 Other chronic pain: Secondary | ICD-10-CM

## 2024-06-03 NOTE — Telephone Encounter (Signed)
 Called patient's wife and explained her that we recommended to get CT of Brain after he sees cardiologist to rule out any cardiovascular problems first. His appointment is on 06/09/24.

## 2024-06-09 ENCOUNTER — Telehealth: Payer: Self-pay

## 2024-06-09 NOTE — Telephone Encounter (Signed)
 Copied from CRM [id number]. Topic: Clinical - Lab/Test Results >> Jun 09, 2024 12:15 PM Fonda T wrote: Reason for CRM: Received call from spouse, Rhoda, calling on behalf of patient.   States they are currently at appointment with cardiologist, and cardiologist states results were not received from previous echocardiogram, and CT scan results.  Requesting results as soon as possible, today, to be sent to cardiologist office for review, can be faxed to Dr. Antonetta office in [place], [place].   Fax to: [phone], Attention [place].  Patient spouse can be reached at [phone].

## 2024-06-15 ENCOUNTER — Ambulatory Visit: Payer: Self-pay | Admitting: Physician Assistant

## 2024-06-18 ENCOUNTER — Telehealth: Payer: Self-pay

## 2024-06-18 ENCOUNTER — Telehealth: Payer: Self-pay | Admitting: Physician Assistant

## 2024-06-18 DIAGNOSIS — G603 Idiopathic progressive neuropathy: Secondary | ICD-10-CM

## 2024-06-18 NOTE — Telephone Encounter (Signed)
 Done. Via epic  Copied from CRM [id number]. Topic: General - Other >> Jun 18, 2024  3:14 PM Wess RAMAN wrote: Patient's wife, Rhoda, stated Ct scan results have not been sent to Dr. Erika, [place].   Fax to: [phone], Attention Dr. [place].    Patient spouse can be reached at [phone]

## 2024-06-18 NOTE — Telephone Encounter (Unsigned)
 Copied from CRM [id number]. Topic: Clinical - Medication Refill >> Jun 18, 2024  3:18 PM Wess S wrote: Medication: pregabalin  (LYRICA ) 25 MG capsule   Has the patient contacted their pharmacy? Yes (Agent: If no, request that the patient contact the pharmacy for the refill. If patient does not wish to contact the pharmacy document the reason why and proceed with request.) (Agent: If yes, when and what did the pharmacy advise?)  This is the patient's preferred pharmacy:  [facility] [address] [place] - [address] [place] [address] Phone: [phone] Fax: [phone]  Is this the correct pharmacy for this prescription? Yes If no, delete pharmacy and type the correct one.   Has the prescription been filled recently? Yes  Is the patient out of the medication? Yes  Has the patient been seen for an appointment in the last year OR does the patient have an upcoming appointment? Yes  Can we respond through MyChart? No  Agent: Please be advised that Rx refills may take up to 3 business days. We ask that you follow-up with your pharmacy.

## 2024-06-19 ENCOUNTER — Other Ambulatory Visit: Payer: Self-pay | Admitting: Physician Assistant

## 2024-06-19 DIAGNOSIS — G603 Idiopathic progressive neuropathy: Secondary | ICD-10-CM

## 2024-06-22 MED ORDER — PREGABALIN 25 MG PO CAPS
25.0000 mg | ORAL_CAPSULE | Freq: Three times a day (TID) | ORAL | 0 refills | Status: DC
Start: 2024-06-22 — End: 2024-07-26

## 2024-07-24 ENCOUNTER — Other Ambulatory Visit: Payer: Self-pay | Admitting: Physician Assistant

## 2024-07-24 DIAGNOSIS — G603 Idiopathic progressive neuropathy: Secondary | ICD-10-CM

## 2024-07-27 ENCOUNTER — Telehealth: Payer: Self-pay

## 2024-07-27 NOTE — Transitions of Care (Post Inpatient/ED Visit) (Signed)
   07/27/2024  Name: Jack Pierce MRN: [id number] DOB: 07-21-1956  Today's TOC FU Call Status: Today's TOC FU Call Status:: Unsuccessful Call (1st Attempt) Unsuccessful Call (1st Attempt) Date: 07/27/24  Attempted to reach the patient regarding the most recent Inpatient/ED visit.  Follow Up Plan: Additional outreach attempts will be made to reach the patient to complete the Transitions of Care (Post Inpatient/ED visit) call.   Signature Avelina Essex, CMA (AAMA)  [facility] [phone]

## 2024-07-27 NOTE — Transitions of Care (Post Inpatient/ED Visit) (Signed)
 07/27/2024  Name: Jack Pierce MRN: [id number] DOB: Sep 29, 1956  Today's TOC FU Call Status: Today's TOC FU Call Status:: Successful TOC FU Call Completed Unsuccessful Call (1st Attempt) Date: 07/27/24 Unsuccessful Call (2nd Attempt) Date: 07/27/24 [facility] FU Call Complete Date: 07/27/24 Patient's Name and Date of Birth confirmed.  Transition Care Management Follow-up Telephone Call Date of Discharge: 07/23/24 Discharge Facility: Other [facility]) Name of Other (Non[facility]) Discharge Facility: [facility] Type of Discharge: Inpatient Admission Primary Inpatient Discharge Diagnosis:: CKD How have you been since you were released from the hospital?: Better Any questions or concerns?: No  Items Reviewed: Did you receive and understand the discharge instructions provided?: Yes Medications obtained,verified, and reconciled?: Yes (Medications Reviewed) Any new allergies since your discharge?: No Dietary orders reviewed?: NA Do you have support at home?: Yes People in Home [RPT]: spouse  Medications Reviewed Today: Medications Reviewed Today     Reviewed by Lang Avelina PARAS, CMA (Certified Medical Assistant) on 07/27/24 at 1657  Med List Status: <None>   Medication Order Taking? Sig Documenting Provider Last Dose Status Informant    Discontinued 07/27/24 1656 (Change in therapy)     Discontinued 07/27/24 1656 (Change in therapy)   chlorthalidone  (HYGROTON ) 25 MG tablet [id number]  Take 1 tablet (25 mg total) by mouth daily. Teressa Harrie HERO, FNP  Active   diltiazem [facility]) 120 MG 24 hr capsule [id number] Yes Take 120 mg by mouth daily. [provider]  Active   ELIQUIS 5 MG TABS tablet [id number] Yes Take 5 mg by mouth 2 (two) times daily. [provider]  Active   fluticasone  (FLONASE ) 50 MCG/ACT nasal spray [id number]  Place 2 sprays into both nostrils daily. Milon Cleaves, PA  Active   folic acid (FOLATE) 400 MCG tablet [id number]  Take 400 mcg by mouth  daily. [provider]  Active   ibuprofen  (ADVIL ) 600 MG tablet [id number]  Take 1 tablet (600 mg total) by mouth 3 (three) times daily with meals. Cox, Kirsten, MD  Active   lamoTRIgine  (LAMICTAL ) 100 MG tablet [id number]  Take 1 tablet (100 mg total) by mouth daily. Sirivol, Mamatha, MD  Active     Discontinued 07/27/24 1656 (Change in therapy)   nitroGLYCERIN  (NITROSTAT ) 0.4 MG SL tablet [id number]  Place 1 tablet (0.4 mg total) under the tongue every 5 (five) minutes as needed for chest pain. Milon Cleaves, PA  Active   pantoprazole  (PROTONIX ) 40 MG tablet [id number]  Take 1 tablet (40 mg total) by mouth 2 (two) times daily. Milon Cleaves, PA  Active   pravastatin  (PRAVACHOL ) 40 MG tablet [id number]  Take 1 tablet (40 mg total) by mouth at bedtime. Teressa Harrie HERO, FNP  Active   pregabalin  (LYRICA ) 25 MG capsule [id number]  TAKE 1 CAPSULE BY MOUTH THREE TIMES DAILY Milon Cleaves, PA  Active             Home Care and Equipment/Supplies: Were Home Health Services Ordered?: NA Any new equipment or medical supplies ordered?: NA  Functional Questionnaire: Do you need assistance with bathing/showering or dressing?: No Do you need assistance with meal preparation?: No Do you need assistance with eating?: No Do you have difficulty maintaining continence: No Do you need assistance with getting out of bed/getting out of a chair/moving?: No Do you have difficulty managing or taking your medications?: No  Follow up appointments reviewed: PCP Follow-up appointment confirmed?: NA (declined will follow up with Cardiology) Specialist Hospital Follow-up appointment confirmed?: Yes Date of Specialist follow-up  appointment?: 07/29/24 Follow-Up Specialty Provider:: Cardiology Do you need transportation to your follow-up appointment?: No Do you understand care options if your condition(s) worsen?: Yes-patient verbalized understanding    SIGNATURE Avelina Essex, CMA (AAMA)  [facility]  Program [phone]

## 2024-07-27 NOTE — Transitions of Care (Post Inpatient/ED Visit) (Signed)
   07/27/2024  Name: Jack Pierce MRN: [id number] DOB: 1956/09/08  Today's TOC FU Call Status: Today's TOC FU Call Status:: Unsuccessful Call (2nd Attempt) Unsuccessful Call (1st Attempt) Date: 07/27/24 Unsuccessful Call (2nd Attempt) Date: 07/27/24  Attempted to reach the patient regarding the most recent Inpatient/ED visit.  Follow Up Plan: Additional outreach attempts will be made to reach the patient to complete the Transitions of Care (Post Inpatient/ED visit) call.   Signature Avelina Essex, CMA (AAMA)  [facility] [phone]

## 2024-07-29 ENCOUNTER — Other Ambulatory Visit: Payer: Self-pay

## 2024-07-29 DIAGNOSIS — G603 Idiopathic progressive neuropathy: Secondary | ICD-10-CM

## 2024-08-10 ENCOUNTER — Other Ambulatory Visit: Payer: Self-pay

## 2024-08-10 ENCOUNTER — Telehealth: Payer: Self-pay

## 2024-08-10 DIAGNOSIS — G603 Idiopathic progressive neuropathy: Secondary | ICD-10-CM

## 2024-08-10 MED ORDER — LAMOTRIGINE 100 MG PO TABS: 100.0000 mg | ORAL_TABLET | Freq: Every day | ORAL | 2 refills | Status: AC

## 2024-08-10 MED ORDER — PREGABALIN 25 MG PO CAPS: 25.0000 mg | ORAL_CAPSULE | Freq: Three times a day (TID) | ORAL | 0 refills | Status: AC

## 2024-08-10 NOTE — Telephone Encounter (Signed)
 Patient came into the office to ask about what vaccines is needed for himself at this time. I told patient it shows flu vaccine and dtap is needed, he verbalized understanding and also was asking if could get a refill on lamotrilgine 100 mg along with a refill on pregabalin . I told patient I would have to send the provider a message on the refills. Patient verbalized understanding at this time as well.  Patient stated on the pregabalin  that I am supposed to take 1 capsule three times daily but I only take it once a day because they only give him 30 pills a time and [facility] has a hard time getting a refill done on this. Also stated  I don't ever get refills on this. Patient was made aware that this medication cannot hold refills on file due to being controlled.   Patient also stated if I switch to a doctor outside of this facility how would I get my records? I told patient he would have to say a paper for release of records and he verbalized understanding and stated  I am thinking about seeing someone outside the facility.

## 2024-08-10 NOTE — Telephone Encounter (Signed)
  error

## 2024-08-26 ENCOUNTER — Ambulatory Visit (INDEPENDENT_AMBULATORY_CARE_PROVIDER_SITE_OTHER)

## 2024-08-26 DIAGNOSIS — Z23 Encounter for immunization: Secondary | ICD-10-CM

## 2024-08-26 NOTE — Progress Notes (Signed)
 Patient is in office today for a nurse visit for Immunization. Patient Injection was given in the  Right deltoid. Patient tolerated injection well.

## 2024-08-27 ENCOUNTER — Ambulatory Visit: Admitting: Physician Assistant

## 2024-09-23 ENCOUNTER — Other Ambulatory Visit: Payer: Self-pay | Admitting: Family Medicine

## 2024-09-23 DIAGNOSIS — E782 Mixed hyperlipidemia: Secondary | ICD-10-CM

## 2025-04-08 ENCOUNTER — Ambulatory Visit
